# Patient Record
Sex: Female | Born: 1980 | Race: White | Hispanic: No | Marital: Married | Smoking: Never smoker
Health system: Southern US, Community
[De-identification: ages and names within clinical notes are randomized; demographics above are authoritative.]

## PROBLEM LIST (undated history)

## (undated) DIAGNOSIS — J45909 Unspecified asthma, uncomplicated: Secondary | ICD-10-CM

## (undated) DIAGNOSIS — R519 Headache, unspecified: Secondary | ICD-10-CM

## (undated) DIAGNOSIS — R569 Unspecified convulsions: Secondary | ICD-10-CM

## (undated) DIAGNOSIS — R51 Headache: Secondary | ICD-10-CM

## (undated) DIAGNOSIS — Z8619 Personal history of other infectious and parasitic diseases: Secondary | ICD-10-CM

## (undated) HISTORY — PX: BONE MARROW HARVEST: SHX896

## (undated) HISTORY — DX: Headache: R51

## (undated) HISTORY — PX: CYST REMOVAL LEG: SHX6280

## (undated) HISTORY — DX: Unspecified asthma, uncomplicated: J45.909

## (undated) HISTORY — DX: Unspecified convulsions: R56.9

## (undated) HISTORY — DX: Headache, unspecified: R51.9

## (undated) HISTORY — PX: DILATION AND EVACUATION: SHX1459

## (undated) HISTORY — DX: Personal history of other infectious and parasitic diseases: Z86.19

---
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
## 2001-08-26 HISTORY — PX: WISDOM TOOTH EXTRACTION: SHX21

## 2008-05-26 ENCOUNTER — Encounter (HOSPITAL_COMMUNITY): Payer: Self-pay | Admitting: Obstetrics and Gynecology

## 2008-05-26 ENCOUNTER — Ambulatory Visit (HOSPITAL_COMMUNITY): Admission: RE | Admit: 2008-05-26 | Discharge: 2008-05-26 | Payer: Self-pay | Admitting: Obstetrics and Gynecology

## 2009-06-07 ENCOUNTER — Inpatient Hospital Stay (HOSPITAL_COMMUNITY): Admission: AD | Admit: 2009-06-07 | Discharge: 2009-06-10 | Payer: Self-pay | Admitting: Obstetrics and Gynecology

## 2009-08-26 HISTORY — PX: HERNIA REPAIR: SHX51

## 2010-03-26 ENCOUNTER — Encounter
Admission: RE | Admit: 2010-03-26 | Discharge: 2010-03-26 | Payer: Self-pay | Source: Home / Self Care | Admitting: Family Medicine

## 2010-06-18 ENCOUNTER — Encounter
Admission: RE | Admit: 2010-06-18 | Discharge: 2010-06-18 | Payer: Self-pay | Source: Home / Self Care | Admitting: Family Medicine

## 2010-08-22 ENCOUNTER — Ambulatory Visit (HOSPITAL_COMMUNITY)
Admission: RE | Admit: 2010-08-22 | Discharge: 2010-08-22 | Payer: Self-pay | Source: Home / Self Care | Attending: Surgery | Admitting: Surgery

## 2010-11-05 LAB — CBC
Hemoglobin: 13.9 g/dL (ref 12.0–15.0)
MCHC: 34.2 g/dL (ref 30.0–36.0)
Platelets: 184 10*3/uL (ref 150–400)
RBC: 4.84 MIL/uL (ref 3.87–5.11)

## 2010-11-05 LAB — COMPREHENSIVE METABOLIC PANEL
ALT: 18 U/L (ref 0–35)
AST: 20 U/L (ref 0–37)
Albumin: 4 g/dL (ref 3.5–5.2)
CO2: 27 mEq/L (ref 19–32)
Calcium: 9.4 mg/dL (ref 8.4–10.5)
Chloride: 106 mEq/L (ref 96–112)
GFR calc Af Amer: >60 mL/min (ref >60)
GFR calc non Af Amer: >60 mL/min (ref >60)
Sodium: 139 mEq/L (ref 135–145)

## 2010-11-05 LAB — DIFFERENTIAL
Eosinophils Absolute: 0.1 10*3/uL (ref 0.0–0.7)
Eosinophils Relative: 1 % (ref 0–5)
Lymphs Abs: 2.6 10*3/uL (ref 0.7–4.0)
Monocytes Absolute: 0.4 10*3/uL (ref 0.1–1.0)

## 2010-11-05 LAB — SURGICAL PCR SCREEN: Staphylococcus aureus: NEGATIVE

## 2010-11-26 ENCOUNTER — Ambulatory Visit (INDEPENDENT_AMBULATORY_CARE_PROVIDER_SITE_OTHER): Payer: Managed Care, Other (non HMO)

## 2010-11-26 ENCOUNTER — Inpatient Hospital Stay (INDEPENDENT_AMBULATORY_CARE_PROVIDER_SITE_OTHER)
Admission: RE | Admit: 2010-11-26 | Discharge: 2010-11-26 | Disposition: A | Discharge status: Home / Self Care | Payer: Self-pay | Source: Ambulatory Visit | Attending: Family Medicine | Admitting: Family Medicine

## 2010-11-26 DIAGNOSIS — S92309A Fracture of unspecified metatarsal bone(s), unspecified foot, initial encounter for closed fracture: Secondary | ICD-10-CM

## 2010-11-29 LAB — CBC
HCT: 31.8 % — ABNORMAL LOW (ref 36.0–46.0)
HCT: 38.3 % (ref 36.0–46.0)
Hemoglobin: 12.8 g/dL (ref 12.0–15.0)
MCHC: 33.5 g/dL (ref 30.0–36.0)
MCHC: 33.7 g/dL (ref 30.0–36.0)
MCV: 87.2 fL (ref 78.0–100.0)
MCV: 87.8 fL (ref 78.0–100.0)
Platelets: 129 10*3/uL — ABNORMAL LOW (ref 150–400)
RBC: 4.39 MIL/uL (ref 3.87–5.11)
RDW: 15.1 % (ref 11.5–15.5)
RDW: 15.3 % (ref 11.5–15.5)
WBC: 9.7 10*3/uL (ref 4.0–10.5)

## 2011-01-08 NOTE — Op Note (Signed)
NAMESENIYAH, Jenna Harrison                ACCOUNT NO.:  [id number]   MEDICAL RECORD NO.:  [id number]          PATIENT TYPE:  AMB   LOCATION:  SDC                           FACILITY:  [facility]   PHYSICIAN:  Zelphia Cairo, MD    DATE OF BIRTH:  Nov 10, 1980   DATE OF PROCEDURE:  DATE OF DISCHARGE:                               OPERATIVE REPORT   PREOPERATIVE DIAGNOSIS:  Missed abortion.   POSTOPERATIVE DIAGNOSIS:  Missed abortion.   PROCEDURES:  1. Cervical block.  2. Suction D and E.   SURGEON:  Zelphia Cairo, MD   ANESTHESIA:  General and local.   SPECIMEN:  Products of conception for pathology testing and chromosome  testing.   ESTIMATED BLOOD LOSS:  400 mL.   COMPLICATIONS:  None.   CONDITION:  Stable to recovery room.   PROCEDURE:  [name] was taken to the operating room where general  anesthesia was obtained.  She was placed in the dorsal lithotomy  position using Allen stirrups.  She was prepped and draped in sterile  fashion and a catheter was used to drain her bladder for approximately  20 mL of clear urine.  Bivalve speculum was placed in the vagina and a  single-toothed tenaculum on the anterior lip of the cervix.  The cervix  was serially dilated using Pratt dilators.  An 8-French suction catheter  was used to remove products of conception from the uterine cavity.  A  gentle curetting was then performed to ensure uterine cry and the  suction catheter was then reinserted to clear any clots and debris.  No  additional tissue was noted in the suction catheter.  Single-toothed  tenaculum was removed.  Hemostasis was assured speculum was removed and  the patient was taken to the recovery room in stable condition.      Zelphia Cairo, MD  Electronically Signed     GA/MEDQ  D:  05/26/2008  T:  05/27/2008  Job:  [id number]

## 2011-05-28 LAB — CBC
Platelets: 208
RDW: 13.8
WBC: 4.8

## 2012-03-10 ENCOUNTER — Other Ambulatory Visit: Payer: Self-pay | Admitting: Family Medicine

## 2012-03-10 DIAGNOSIS — H539 Unspecified visual disturbance: Secondary | ICD-10-CM

## 2012-03-11 ENCOUNTER — Ambulatory Visit
Admission: RE | Admit: 2012-03-11 | Discharge: 2012-03-11 | Disposition: A | Discharge status: Home / Self Care | Payer: BC Managed Care – PPO | Source: Ambulatory Visit | Attending: Family Medicine | Admitting: Family Medicine

## 2012-03-11 DIAGNOSIS — H539 Unspecified visual disturbance: Secondary | ICD-10-CM

## 2012-03-11 MED ORDER — GADOBENATE DIMEGLUMINE 529 MG/ML IV SOLN: 14.0000 mL | Freq: Once | INTRAVENOUS | Status: AC | PRN

## 2012-07-10 ENCOUNTER — Other Ambulatory Visit: Payer: Self-pay | Admitting: Family Medicine

## 2012-07-10 DIAGNOSIS — R102 Pelvic and perineal pain: Secondary | ICD-10-CM

## 2012-07-10 DIAGNOSIS — R109 Unspecified abdominal pain: Secondary | ICD-10-CM

## 2012-07-13 ENCOUNTER — Other Ambulatory Visit: Payer: BC Managed Care – PPO

## 2012-07-13 ENCOUNTER — Ambulatory Visit
Admission: RE | Admit: 2012-07-13 | Discharge: 2012-07-13 | Disposition: A | Discharge status: Home / Self Care | Payer: BC Managed Care – PPO | Source: Ambulatory Visit | Attending: Family Medicine | Admitting: Family Medicine

## 2012-07-13 DIAGNOSIS — R109 Unspecified abdominal pain: Secondary | ICD-10-CM

## 2012-07-13 DIAGNOSIS — R102 Pelvic and perineal pain unspecified side: Secondary | ICD-10-CM

## 2012-07-16 ENCOUNTER — Ambulatory Visit
Admission: RE | Admit: 2012-07-16 | Discharge: 2012-07-16 | Disposition: A | Discharge status: Home / Self Care | Payer: BC Managed Care – PPO | Source: Ambulatory Visit | Attending: Physician Assistant | Admitting: Physician Assistant

## 2012-07-16 ENCOUNTER — Other Ambulatory Visit: Payer: BC Managed Care – PPO

## 2012-07-16 ENCOUNTER — Other Ambulatory Visit: Payer: Self-pay | Admitting: Physician Assistant

## 2012-07-16 DIAGNOSIS — R52 Pain, unspecified: Secondary | ICD-10-CM

## 2012-07-16 DIAGNOSIS — R109 Unspecified abdominal pain: Secondary | ICD-10-CM

## 2012-07-16 MED ORDER — IOHEXOL 300 MG/ML  SOLN
100.0000 mL | Freq: Once | INTRAMUSCULAR | Status: AC | PRN
  Administered 2012-07-16: 100 mL via INTRAVENOUS

## 2012-08-10 ENCOUNTER — Encounter (INDEPENDENT_AMBULATORY_CARE_PROVIDER_SITE_OTHER): Payer: BC Managed Care – PPO | Admitting: Surgery

## 2012-08-28 ENCOUNTER — Encounter (INDEPENDENT_AMBULATORY_CARE_PROVIDER_SITE_OTHER): Payer: Self-pay | Admitting: Surgery

## 2012-08-28 ENCOUNTER — Ambulatory Visit (INDEPENDENT_AMBULATORY_CARE_PROVIDER_SITE_OTHER): Payer: BC Managed Care – PPO | Admitting: Surgery

## 2012-08-28 VITALS — BP 118/78 | HR 68 | Temp 98.5°F | Resp 16 | Ht 68.0 in | Wt 153.4 lb

## 2012-08-28 DIAGNOSIS — R109 Unspecified abdominal pain: Secondary | ICD-10-CM

## 2012-08-28 NOTE — Progress Notes (Signed)
Patient ID: Jenna Harrison, female   DOB: Jun 02, 1981, 32 y.o.   MRN: [id number]  Chief Complaint  Patient presents with  . Routine Post Op    abd pain    HPI Jenna Harrison is a 32 y.o. female.  Patient returns do to lower abdominal pain. She has pain mostly in her left lower quadrant but sometimes a right lower quadrant. It is made worse with exercise. She is able to run about 5 miles but then develops lower abdominal pain. The pain is made better with rest. There is a bulge in her left lower quadrant abdominal wall associated with this. She does have some periumbilical pain as well. This is made worse with movement. CT scan was done to evaluate we shows no evidence of hernia or other intra-abdominal abnormality. Pelvic ultrasound is normal. HPI  Past Medical History  Diagnosis Date  . Asthma   . Seizures     Past Surgical History  Procedure Date  . Hernia repair 2011  . Wisdom tooth extraction 2003  . Tonsillectomy and adenoidectomy 2001  . Cyst removal leg     Knee  . Vaginal delivery 2010  . Dilation and evacuation     Family History  Problem Relation Age of Onset  . Cancer Mother     Brain,Lung    Social History History  Substance Use Topics  . Smoking status: Never Smoker   . Smokeless tobacco: Never Used  . Alcohol Use: Yes     Comment: Social    Allergies  Allergen Reactions  . Tramadol     Pass Out  . Sulfa Antibiotics     Current Outpatient Prescriptions  Medication Sig Dispense Refill  . ORSYTHIA 0.1-20 MG-MCG tablet         Review of Systems Review of Systems  Constitutional: Negative.   HENT: Negative.   Eyes: Negative.   Respiratory: Negative.   Cardiovascular: Negative.   Gastrointestinal: Positive for nausea and abdominal pain.  Genitourinary: Negative.   Musculoskeletal: Negative.   Neurological: Negative.   Hematological: Negative.   Psychiatric/Behavioral: Negative.     Blood pressure 118/78, pulse 68, temperature 98.5 F (36.9  C), temperature source Temporal, resp. rate 16, height 5\' 8"  (1.727 m), weight 153 lb 6.4 oz (69.582 kg).  Physical Exam Physical Exam  Constitutional: She appears well-developed and well-nourished.  HENT:  Head: Normocephalic and atraumatic.  Eyes: EOM are normal. Pupils are equal, round, and reactive to light.  Neck: Normal range of motion. Neck supple.  Abdominal:      Data Reviewed Clinical Data: 32 year old female with nausea vomiting and fever.  Periumbilical pain. History of bilateral inguinal hernia repair.  CT ABDOMEN AND PELVIS WITH CONTRAST  Technique: Multidetector CT imaging of the abdomen and pelvis was  performed following the standard protocol during bolus  administration of intravenous contrast.  Contrast: OMNIPAQUE IOHEXOL 300 MG/ML SOLN  Comparison: abdominal and pelvic ultrasound 07/13/2012. CT abdomen  without contrast 06/18/2010.  Findings: Lung bases remain clear. No acute osseous abnormality  identified. Incidental incompletely fused posterior elements in  the lower thoracic spine. Mild chronic deformity of the right  iliac wing.  Bilateral inguinal ligament surgical clips. Otherwise normal  inguinal regions. No pelvic free fluid. Uterus and adnexa within  normal limits.  Oral contrast has reached the rectum. Negative distal colon. The  appendix is not clearly delineated, but there are no pericecal  inflammatory changes. The terminal ileum and ileocecal valve are  normal. The  appendix may be collapse and extending along the right  pelvic sidewall, with no surrounding inflammation. No dilated  small bowel loops. Stomach and duodenum within normal limits.  Liver, gallbladder, spleen, pancreas, adrenal glands and portal  venous system within normal limits. Major arterial structures in  the abdomen and pelvis are normal.  Bilateral extrarenal pelves, no perinephric stranding, and no  hydroureter or periureteral stranding. No urologic calculus    identified. Bladder is unremarkable.  No ventral abdominal hernia identified. No mesenteric  inflammation. No abdominal free fluid. No lymphadenopathy  identified.  IMPRESSION:  1. No acute inflammatory findings in the abdomen or pelvis.  Appendix not clearly delineated, but no pericecal inflammation.  2. Previous bilateral inguinal hernia repair with no adverse  features.  3. Suspect extrarenal pelves rather than hydronephrosis responsible  for the recent ultrasound appearance of the kidneys. No urologic  calculus or other secondary findings of obstructive uropathy.   Assessment    Abdominal pain      Plan    Laparoscopy to further evaluate abdominal pain. She had previous a lateral laparoscopic inguinal hernia repair 2 years ago and the pain is made worse with excessive exercise. She uses a bulge in her left lower quadrant as well which is new. Workup has included a CT scan, pelvic ultrasound and these have all been negative for any abnormality. She would like to proceed with diagnostic laparoscopy to further evaluate her lower abdominal pain.The procedure has been discussed with the patient.  Alternative therapies have been discussed with the patient.  Operative risks include bleeding,  Infection,  Organ injury,  Nerve injury,  Blood vessel injury,  DVT,  Pulmonary embolism,  Death,  And possible reoperation.  Medical management risks include worsening of present situation.  The success of the procedure is 50 -90 % at treating patients symptoms.  The patient understands and agrees to proceed.       Dominick Zertuche A. 08/28/2012, 5:37 PM

## 2012-08-28 NOTE — Patient Instructions (Signed)
Call when ready to schedule surgery

## 2012-09-03 ENCOUNTER — Encounter (INDEPENDENT_AMBULATORY_CARE_PROVIDER_SITE_OTHER): Payer: Self-pay

## 2012-09-03 ENCOUNTER — Telehealth (INDEPENDENT_AMBULATORY_CARE_PROVIDER_SITE_OTHER): Payer: Self-pay

## 2012-09-03 NOTE — Telephone Encounter (Signed)
I called patient and told her expectant time out of work is 2-4 weeks. I also explained that there could be complication in surgery that could always keep her out longer. She said she wanted note to state up to 4 weeks since she has gone through this type surgery before and she went back to work in 3 weeks. I told her driving restriction was at least 10 days and could be up to 2 weeks. I told her no driving while on pain medication. Patient wants note faxed to [phone] attn Gerri Lins.

## 2012-09-03 NOTE — Telephone Encounter (Signed)
Message copied by Brennan Bailey on Thu Sep 03, 2012  9:35 AM ------      Message from: Rise Paganini      Created: Wed Sep 02, 2012  8:33 AM      Regarding: Cornett      Contact: [phone]       Patient stated that she will need a note with amount of days that she will be out of work. Please include the driving restriction and any other details. Call if you have any questions.

## 2012-09-08 ENCOUNTER — Telehealth (INDEPENDENT_AMBULATORY_CARE_PROVIDER_SITE_OTHER): Payer: Self-pay

## 2012-09-08 NOTE — Telephone Encounter (Signed)
LMOM that I returned phone call. If pt calls back, please get the information she needs in the letter and the fax number and I will be glad to letter to her employer.

## 2012-09-08 NOTE — Telephone Encounter (Signed)
Message copied by Brennan Bailey on Tue Sep 08, 2012  1:23 PM ------      Message from: Delcie Roch      Created: Tue Sep 08, 2012  8:39 AM      Regarding: return to work      Contact: [phone]       Pt's employer is not satisfied with the previous return to work information that was supplied because it is an estimate.  Please call the patient.

## 2012-09-15 ENCOUNTER — Telehealth (INDEPENDENT_AMBULATORY_CARE_PROVIDER_SITE_OTHER): Payer: Self-pay

## 2012-09-15 NOTE — Telephone Encounter (Signed)
Patient called to say her OB/GYN told her she tested positive for parasite. She is being referred to Dr. Kinnie Scales for treatment but cannot get an appointment because he is out of office sick. She called to ask if this would affect her surgery that is scheduled for 1/30. I told her I would call her Monday when Cornett is back in office and touch base with her and see if she has appt with [facility] yet.

## 2012-09-24 DIAGNOSIS — R109 Unspecified abdominal pain: Secondary | ICD-10-CM

## 2012-10-09 ENCOUNTER — Telehealth (INDEPENDENT_AMBULATORY_CARE_PROVIDER_SITE_OTHER): Payer: Self-pay

## 2012-10-09 NOTE — Telephone Encounter (Signed)
Message copied by Brennan Bailey on Fri Oct 09, 2012  1:23 PM ------      Message from: Delcie Roch      Created: Fri Oct 09, 2012 12:40 PM      Regarding: R/S FOR 1ST PO - [facility]      Contact: [phone]       PLEASE R/S PTS 1ST PO - [facility] ------

## 2012-10-09 NOTE — Telephone Encounter (Signed)
LMOM with new appt info. Told her to call if she could not make that appt.

## 2012-10-12 ENCOUNTER — Encounter (INDEPENDENT_AMBULATORY_CARE_PROVIDER_SITE_OTHER): Payer: BC Managed Care – PPO | Admitting: Surgery

## 2012-10-27 ENCOUNTER — Encounter (INDEPENDENT_AMBULATORY_CARE_PROVIDER_SITE_OTHER): Payer: Self-pay | Admitting: Surgery

## 2012-10-27 ENCOUNTER — Ambulatory Visit (INDEPENDENT_AMBULATORY_CARE_PROVIDER_SITE_OTHER): Payer: BC Managed Care – PPO | Admitting: Surgery

## 2012-10-27 VITALS — BP 108/68 | HR 64 | Resp 14 | Ht 68.0 in | Wt 157.6 lb

## 2012-10-27 DIAGNOSIS — Z9889 Other specified postprocedural states: Secondary | ICD-10-CM

## 2012-10-27 NOTE — Progress Notes (Signed)
Patient returns after diagnostic laparoscopy for pelvic pain. The results showed no abnormality. She has no recurrent hernia. Internal organs are normal.  Exam: The incision at umbilicus is clean dry and intact.  Impression: Status post diagnostic laparoscopy for pelvic and groin pain with history of bilateral inguinal hernia repair with mesh  Plan: Refer to pain clinic to see if they can help with her pain. There is no structural abnormality to explain her pain. I discussed mesh removal but this would leave her with the same amount of pain postop or worse. There is still an option but this needs to be the last option. She also has pain around her umbilicus and etiology of this is unclear.

## 2012-10-27 NOTE — Patient Instructions (Signed)
Will refer to pain clinic for evaluation of pain.

## 2012-10-28 ENCOUNTER — Other Ambulatory Visit (INDEPENDENT_AMBULATORY_CARE_PROVIDER_SITE_OTHER): Payer: Self-pay | Admitting: General Surgery

## 2014-08-09 LAB — OB RESULTS CONSOLE RUBELLA ANTIBODY, IGM
RUBELLA: NON-IMMUNE/NOT IMMUNE
Rubella: NON-IMMUNE/NOT IMMUNE

## 2014-08-24 LAB — OB RESULTS CONSOLE RPR: RPR: NONREACTIVE

## 2014-08-24 LAB — OB RESULTS CONSOLE ABO/RH: RH TYPE: POSITIVE

## 2014-08-24 LAB — OB RESULTS CONSOLE HIV ANTIBODY (ROUTINE TESTING): HIV: NONREACTIVE

## 2014-08-24 LAB — OB RESULTS CONSOLE GC/CHLAMYDIA
Chlamydia: NEGATIVE
GC PROBE AMP, GENITAL: NEGATIVE

## 2014-08-24 LAB — OB RESULTS CONSOLE RUBELLA ANTIBODY, IGM: RUBELLA: IMMUNE

## 2014-08-24 LAB — OB RESULTS CONSOLE ANTIBODY SCREEN: ANTIBODY SCREEN: NEGATIVE

## 2014-08-24 LAB — OB RESULTS CONSOLE HEPATITIS B SURFACE ANTIGEN: HEP B S AG: NEGATIVE

## 2014-08-26 NOTE — L&D Delivery Note (Signed)
Delivery Note  SVD viable female over 2nd degree ML lac.  Nuchal reduced but with delivery cord avulsed and immediately clamped at umbilicus.  The baby had slow response to stimulation so NICU called and resusitation started..  Placenta delivered spontaneously intact with 3VC. Repair with 2-0 Chromic with good support and hemostasis noted and R/V exam confirms.  PH art was sent.  [place] cord blood was not done.  Mother and baby were doing well.  EBL 300cc  Candice Camp, MD

## 2014-08-30 ENCOUNTER — Other Ambulatory Visit: Payer: Self-pay | Admitting: Obstetrics and Gynecology

## 2014-08-31 LAB — CYTOLOGY - PAP

## 2014-12-14 ENCOUNTER — Inpatient Hospital Stay (HOSPITAL_COMMUNITY)
Admission: AD | Admit: 2014-12-14 | Payer: BC Managed Care – PPO | Source: Ambulatory Visit | Admitting: Obstetrics and Gynecology

## 2015-02-17 LAB — OB RESULTS CONSOLE GBS: GBS: POSITIVE

## 2015-03-16 ENCOUNTER — Telehealth (HOSPITAL_COMMUNITY): Payer: Self-pay | Admitting: *Deleted

## 2015-03-16 ENCOUNTER — Encounter (HOSPITAL_COMMUNITY): Payer: Self-pay | Admitting: *Deleted

## 2015-03-16 NOTE — Telephone Encounter (Signed)
Preadmission screen  

## 2015-03-17 ENCOUNTER — Inpatient Hospital Stay (HOSPITAL_COMMUNITY): Payer: BC Managed Care – PPO | Admitting: Anesthesiology

## 2015-03-17 ENCOUNTER — Encounter (HOSPITAL_COMMUNITY): Payer: Self-pay

## 2015-03-17 ENCOUNTER — Inpatient Hospital Stay (HOSPITAL_COMMUNITY)
Admission: RE | Admit: 2015-03-17 | Discharge: 2015-03-19 | DRG: 775 | Disposition: A | Discharge status: Home / Self Care | Payer: BC Managed Care – PPO | Source: Ambulatory Visit | Attending: Obstetrics and Gynecology | Admitting: Obstetrics and Gynecology

## 2015-03-17 DIAGNOSIS — O99824 Streptococcus B carrier state complicating childbirth: Secondary | ICD-10-CM | POA: Diagnosis present

## 2015-03-17 DIAGNOSIS — Z3490 Encounter for supervision of normal pregnancy, unspecified, unspecified trimester: Secondary | ICD-10-CM

## 2015-03-17 DIAGNOSIS — Z3A39 39 weeks gestation of pregnancy: Secondary | ICD-10-CM | POA: Diagnosis present

## 2015-03-17 DIAGNOSIS — Z823 Family history of stroke: Secondary | ICD-10-CM | POA: Diagnosis not present

## 2015-03-17 DIAGNOSIS — O9989 Other specified diseases and conditions complicating pregnancy, childbirth and the puerperium: Secondary | ICD-10-CM | POA: Diagnosis present

## 2015-03-17 LAB — CBC
HCT: 33.7 % — ABNORMAL LOW (ref 36.0–46.0)
HEMOGLOBIN: 11.4 g/dL — AB (ref 12.0–15.0)
MCH: 28.9 pg (ref 26.0–34.0)
MCHC: 33.8 g/dL (ref 30.0–36.0)
MCV: 85.5 fL (ref 78.0–100.0)
Platelets: 157 10*3/uL (ref 150–400)
RBC: 3.94 MIL/uL (ref 3.87–5.11)
RDW: 14.2 % (ref 11.5–15.5)
WBC: 8.8 10*3/uL (ref 4.0–10.5)

## 2015-03-17 LAB — TYPE AND SCREEN
ABO/RH(D): O POS
Antibody Screen: NEGATIVE

## 2015-03-17 LAB — RPR: RPR: NONREACTIVE

## 2015-03-17 LAB — ABO/RH: ABO/RH(D): O POS

## 2015-03-17 MED ORDER — FLEET ENEMA 7-19 GM/118ML RE ENEM: 1.0000 | ENEMA | RECTAL | Status: DC | PRN

## 2015-03-17 MED ORDER — MISOPROSTOL 25 MCG QUARTER TABLET
25.0000 ug | ORAL_TABLET | ORAL | Status: DC | PRN
  Administered 2015-03-17: 25 ug via VAGINAL
  Filled 2015-03-17: qty 0.25
  Filled 2015-03-17: qty 1

## 2015-03-17 MED ORDER — OXYTOCIN BOLUS FROM INFUSION: 500.0000 mL | INTRAVENOUS | Status: DC

## 2015-03-17 MED ORDER — MEDROXYPROGESTERONE ACETATE 150 MG/ML IM SUSP: 150.0000 mg | INTRAMUSCULAR | Status: DC | PRN

## 2015-03-17 MED ORDER — CITRIC ACID-SODIUM CITRATE 334-500 MG/5ML PO SOLN: 30.0000 mL | ORAL | Status: DC | PRN

## 2015-03-17 MED ORDER — EPHEDRINE 5 MG/ML INJ
10.0000 mg | INTRAVENOUS | Status: DC | PRN
  Filled 2015-03-17: qty 2

## 2015-03-17 MED ORDER — ONDANSETRON HCL 4 MG/2ML IJ SOLN: 4.0000 mg | INTRAMUSCULAR | Status: DC | PRN

## 2015-03-17 MED ORDER — LACTATED RINGERS IV SOLN
500.0000 mL | INTRAVENOUS | Status: DC | PRN
  Administered 2015-03-17: 1000 mL via INTRAVENOUS

## 2015-03-17 MED ORDER — ONDANSETRON HCL 4 MG/2ML IJ SOLN: 4.0000 mg | Freq: Four times a day (QID) | INTRAMUSCULAR | Status: DC | PRN

## 2015-03-17 MED ORDER — DIPHENHYDRAMINE HCL 25 MG PO CAPS: 25.0000 mg | ORAL_CAPSULE | Freq: Four times a day (QID) | ORAL | Status: DC | PRN

## 2015-03-17 MED ORDER — SIMETHICONE 80 MG PO CHEW: 80.0000 mg | CHEWABLE_TABLET | ORAL | Status: DC | PRN

## 2015-03-17 MED ORDER — OXYTOCIN 40 UNITS IN LACTATED RINGERS INFUSION - SIMPLE MED
62.5000 mL/h | INTRAVENOUS | Status: DC
  Administered 2015-03-17: 62.5 mL/h via INTRAVENOUS

## 2015-03-17 MED ORDER — OXYCODONE-ACETAMINOPHEN 5-325 MG PO TABS: 2.0000 | ORAL_TABLET | ORAL | Status: DC | PRN

## 2015-03-17 MED ORDER — LIDOCAINE HCL (PF) 1 % IJ SOLN
INTRAMUSCULAR | Status: DC | PRN
  Administered 2015-03-17 (×2): 8 mL via EPIDURAL

## 2015-03-17 MED ORDER — LACTATED RINGERS IV SOLN: INTRAVENOUS | Status: DC

## 2015-03-17 MED ORDER — PENICILLIN G POTASSIUM 5000000 UNITS IJ SOLR
5.0000 10*6.[IU] | Freq: Once | INTRAVENOUS | Status: AC
  Administered 2015-03-17: 5 10*6.[IU] via INTRAVENOUS
  Filled 2015-03-17: qty 5

## 2015-03-17 MED ORDER — FENTANYL 2.5 MCG/ML BUPIVACAINE 1/10 % EPIDURAL INFUSION (WH - ANES)
14.0000 mL/h | INTRAMUSCULAR | Status: DC | PRN
  Administered 2015-03-17: 14 mL/h via EPIDURAL
  Filled 2015-03-17: qty 125

## 2015-03-17 MED ORDER — WITCH HAZEL-GLYCERIN EX PADS: 1.0000 "application " | MEDICATED_PAD | CUTANEOUS | Status: DC | PRN

## 2015-03-17 MED ORDER — ZOLPIDEM TARTRATE 5 MG PO TABS: 5.0000 mg | ORAL_TABLET | Freq: Every evening | ORAL | Status: DC | PRN

## 2015-03-17 MED ORDER — DIPHENHYDRAMINE HCL 50 MG/ML IJ SOLN: 12.5000 mg | INTRAMUSCULAR | Status: DC | PRN

## 2015-03-17 MED ORDER — LIDOCAINE HCL (PF) 1 % IJ SOLN
30.0000 mL | INTRAMUSCULAR | Status: DC | PRN
  Filled 2015-03-17: qty 30

## 2015-03-17 MED ORDER — TETANUS-DIPHTH-ACELL PERTUSSIS 5-2.5-18.5 LF-MCG/0.5 IM SUSP: 0.5000 mL | Freq: Once | INTRAMUSCULAR | Status: DC

## 2015-03-17 MED ORDER — DIBUCAINE 1 % RE OINT: 1.0000 "application " | TOPICAL_OINTMENT | RECTAL | Status: DC | PRN

## 2015-03-17 MED ORDER — ACETAMINOPHEN 325 MG PO TABS: 650.0000 mg | ORAL_TABLET | ORAL | Status: DC | PRN

## 2015-03-17 MED ORDER — PENICILLIN G POTASSIUM 5000000 UNITS IJ SOLR
2.5000 10*6.[IU] | INTRAVENOUS | Status: DC
  Administered 2015-03-17: 2.5 10*6.[IU] via INTRAVENOUS
  Filled 2015-03-17 (×6): qty 2.5

## 2015-03-17 MED ORDER — SENNOSIDES-DOCUSATE SODIUM 8.6-50 MG PO TABS
2.0000 | ORAL_TABLET | ORAL | Status: DC
  Administered 2015-03-17: 2 via ORAL
  Filled 2015-03-17 (×2): qty 2

## 2015-03-17 MED ORDER — PHENYLEPHRINE 40 MCG/ML (10ML) SYRINGE FOR IV PUSH (FOR BLOOD PRESSURE SUPPORT)
80.0000 ug | PREFILLED_SYRINGE | INTRAVENOUS | Status: DC | PRN
  Filled 2015-03-17: qty 2
  Filled 2015-03-17: qty 20

## 2015-03-17 MED ORDER — ONDANSETRON HCL 4 MG PO TABS: 4.0000 mg | ORAL_TABLET | ORAL | Status: DC | PRN

## 2015-03-17 MED ORDER — BENZOCAINE-MENTHOL 20-0.5 % EX AERO
1.0000 "application " | INHALATION_SPRAY | CUTANEOUS | Status: DC | PRN
  Administered 2015-03-17 – 2015-03-19 (×2): 1 via TOPICAL
  Filled 2015-03-17 (×2): qty 56

## 2015-03-17 MED ORDER — OXYCODONE-ACETAMINOPHEN 5-325 MG PO TABS: 1.0000 | ORAL_TABLET | ORAL | Status: DC | PRN

## 2015-03-17 MED ORDER — MEASLES, MUMPS & RUBELLA VAC ~~LOC~~ INJ
0.5000 mL | INJECTION | Freq: Once | SUBCUTANEOUS | Status: AC
  Administered 2015-03-19: 0.5 mL via SUBCUTANEOUS
  Filled 2015-03-17: qty 0.5

## 2015-03-17 MED ORDER — LANOLIN HYDROUS EX OINT: TOPICAL_OINTMENT | CUTANEOUS | Status: DC | PRN

## 2015-03-17 MED ORDER — OXYTOCIN 40 UNITS IN LACTATED RINGERS INFUSION - SIMPLE MED
1.0000 m[IU]/min | INTRAVENOUS | Status: DC
  Administered 2015-03-17: 2 m[IU]/min via INTRAVENOUS
  Filled 2015-03-17: qty 1000

## 2015-03-17 MED ORDER — ALBUTEROL SULFATE (2.5 MG/3ML) 0.083% IN NEBU: 3.0000 mL | INHALATION_SOLUTION | Freq: Four times a day (QID) | RESPIRATORY_TRACT | Status: DC | PRN

## 2015-03-17 MED ORDER — TERBUTALINE SULFATE 1 MG/ML IJ SOLN
0.2500 mg | Freq: Once | INTRAMUSCULAR | Status: DC | PRN
  Filled 2015-03-17: qty 1

## 2015-03-17 MED ORDER — PRENATAL MULTIVITAMIN CH
1.0000 | ORAL_TABLET | Freq: Every day | ORAL | Status: DC
  Administered 2015-03-18 – 2015-03-19 (×2): 1 via ORAL
  Filled 2015-03-17 (×2): qty 1

## 2015-03-17 MED ORDER — IBUPROFEN 600 MG PO TABS
600.0000 mg | ORAL_TABLET | Freq: Four times a day (QID) | ORAL | Status: DC
  Administered 2015-03-17 – 2015-03-19 (×8): 600 mg via ORAL
  Filled 2015-03-17 (×8): qty 1

## 2015-03-17 NOTE — Consult Note (Signed)
Neonatology Note:  Attendance at Code Apgar:   Our team responded to a Code Apgar call to room # 168 following NSVD, due to infant with apnea following NSVD complicated by cord avulsion (occurring after delivery of head but before body). The requesting physician was Dr. Rana Snare. The mother is a G3P1A1 O pos, GBS neg with an uncomplicated pregnancy. ROM occurred 7 hours PTD and the fluid was clear.  At delivery, the baby was apneic and floppy with a HR < 100. The OB nursing staff in attendance gave vigorous stimulation and a Code Apgar was called. They applied PPV for about 30 seconds. Our team arrived at 2 minutes of life, at which time the baby was still apneic, so PPV was continued by E. Snyder, RT for about another 30 seconds. The baby began to cry at 3 minutes and tone rapidly improved. O2 saturations were within expected parameters by 4-5 minutes in room air. Perfusion was good, no respiratory distress, and lungs clear to auscultation. HR was about 175-180. The 5-min Apgar score was 9 (1-min score to be assigned by OB nursing staff).  I spoke with the parents in the DR, then transferred the baby to the Pediatrician's care.   Doretha Sou, MD

## 2015-03-17 NOTE — Anesthesia Preprocedure Evaluation (Signed)
Anesthesia Evaluation  Patient identified by MRN, date of birth, ID band Patient awake    Reviewed: Allergy & Precautions, H&P , NPO status , Patient's Chart, lab work & pertinent test results  Airway Mallampati: II  TM Distance: >3 FB Neck ROM: full    Dental no notable dental hx.    Pulmonary    Pulmonary exam normal       Cardiovascular negative cardio ROS Normal cardiovascular exam    Neuro/Psych negative psych ROS   GI/Hepatic negative GI ROS, Neg liver ROS,   Endo/Other  negative endocrine ROS  Renal/GU negative Renal ROS     Musculoskeletal   Abdominal (+) + obese,   Peds  Hematology negative hematology ROS (+)   Anesthesia Other Findings   Reproductive/Obstetrics (+) Pregnancy                             Anesthesia Physical Anesthesia Plan  ASA: II  Anesthesia Plan: Epidural   Post-op Pain Management:    Induction:   Airway Management Planned:   Additional Equipment:   Intra-op Plan:   Post-operative Plan:   Informed Consent: I have reviewed the patients History and Physical, chart, labs and discussed the procedure including the risks, benefits and alternatives for the proposed anesthesia with the patient or authorized representative who has indicated his/her understanding and acceptance.     Plan Discussed with:   Anesthesia Plan Comments:         Anesthesia Quick Evaluation

## 2015-03-17 NOTE — Anesthesia Procedure Notes (Signed)
Epidural Patient location during procedure: OB Start time: 03/17/2015 11:12 AM End time: 03/17/2015 11:17 AM  Staffing Anesthesiologist: Leilani Able Performed by: anesthesiologist   Preanesthetic Checklist Completed: patient identified, site marked, surgical consent, pre-op evaluation, timeout performed, IV checked, risks and benefits discussed and monitors and equipment checked  Epidural Patient position: sitting Prep: site prepped and draped and DuraPrep Patient monitoring: continuous pulse ox and blood pressure Approach: midline Location: L3-L4 Injection technique: LOR air  Needle:  Needle type: Tuohy  Needle gauge: 17 G Needle length: 9 cm and 9 Needle insertion depth: 5 cm cm Catheter type: closed end flexible Catheter size: 19 Gauge Catheter at skin depth: 10 cm Test dose: negative and Other  Assessment Sensory level: T9 Events: blood not aspirated, injection not painful, no injection resistance, negative IV test and no paresthesia

## 2015-03-17 NOTE — H&P (Signed)
Jenna Harrison is a 34 y.o. female presenting for IOL per Dr Renaldo Fiddler.  Preg uncomplicated.  GBS -. History OB History    Gravida Para Term Preterm AB TAB SAB Ectopic Multiple Living   Past Medical History  Diagnosis Date  . Asthma   . Seizures   . Hx of varicella   . Headache     migraines   Past Surgical History  Procedure Laterality Date  . Hernia repair  2011  . Wisdom tooth extraction  2003  . Tonsillectomy and adenoidectomy  2001  . Cyst removal leg      Knee  . Vaginal delivery  2010  . Dilation and evacuation    . Bone marrow harvest      with a bone cyst also removed   Family History: family history includes Cancer in her father and mother; Seizures in her sister; Stroke in her maternal grandmother; Thyroid disease in her maternal aunt. Social History:  reports that she has never smoked. She has never used smokeless tobacco. She reports that she drinks alcohol. She reports that she does not use illicit drugs.   Prenatal Transfer Tool  Maternal Diabetes: No Genetic Screening: Normal Maternal Ultrasounds/Referrals: Normal Fetal Ultrasounds or other Referrals:  None Maternal Substance Abuse:  No Significant Maternal Medications:  None Significant Maternal Lab Results:  None Other Comments:  None  ROS  Dilation: 2 Effacement (%): Thick Station: -2 Exam by:: Jenna Harrison Blood pressure 110/62, pulse 67, temperature 98.3 F (36.8 C), temperature source Oral, resp. rate 20, height  (1.727 m), weight 210 lb 2 oz (95.312 kg), last menstrual period 06/13/2014. Exam Physical Exam  Prenatal labs: ABO, Rh: --/--/O POS, O POS (07/22 0115) Antibody: NEG (07/22 0115) Rubella: Immune (12/30 0000) RPR: Nonreactive (12/30 0000)  HBsAg: Negative (12/30 0000)  HIV: Non-reactive (12/30 0000)  GBS: Positive (06/24 0000)   Assessment/Plan: IUP at 39 + weeks Desires IOL [place] last week EFM 8+3lbs S/P cytotec.  Pitocin and AROM now    Jenna Harrison  C 03/17/2015, 9:05 AM

## 2015-03-18 LAB — CBC
HCT: 31.9 % — ABNORMAL LOW (ref 36.0–46.0)
HEMOGLOBIN: 10.4 g/dL — AB (ref 12.0–15.0)
MCH: 27.8 pg (ref 26.0–34.0)
MCHC: 32.6 g/dL (ref 30.0–36.0)
MCV: 85.3 fL (ref 78.0–100.0)
Platelets: 155 10*3/uL (ref 150–400)
RBC: 3.74 MIL/uL — AB (ref 3.87–5.11)
RDW: 14.3 % (ref 11.5–15.5)
WBC: 9.4 10*3/uL (ref 4.0–10.5)

## 2015-03-18 NOTE — Lactation Note (Signed)
This note was copied from the chart of Jenna Harrison. Lactation Consultation Note  Patient Name: Jenna Harrison [id number] Date: 03/18/2015 Reason for consult: Initial assessment Visited with Mom and FOB, baby 35 hrs old.  This is Mom's second child.  Her first child, she was told to stop breastfeeding due to probably lactose intolerance.  It turned out to be a GI problem other than intolerance to breast milk.  Mom really wants breastfeeding to go well.  Baby has fed 5 times in first day.  Mom started to position baby for a feeding.  Encouraged her to undress baby to a diaper, with explanation on why this is beneficial.  Mom started to position baby in cradle hold, and baby latched onto nipple.  Mom has large erect nipples and " baby usually latches onto nipple and then works her way deeper" per Mom.  Talked with Mom about using a cross cradle hold, and pillow support.  With assistance, Mom able to latch baby with a deep, wide gape of mouth.  Teaching done on how to recognize nutritive sucking.  Basics reviewed with Mom.  Brochure left in room.  Explained about IP and OP lactation services available. Encouraged Mom to call prn, and Follow-up in am.    Consult Status Consult Status: Follow-up Date: 03/19/15 Follow-up type: In-patient    Jenna Harrison 03/18/2015, 2:51 PM

## 2015-03-18 NOTE — Progress Notes (Signed)
Post Partum Day 1 Subjective: no complaints, up ad lib, voiding and tolerating PO  Objective: Blood pressure 104/55, pulse 66, temperature 98.1 F (36.7 C), temperature source Oral, resp. rate 16, height  (1.727 m), weight 210 lb 2 oz (95.312 kg), last menstrual period 06/13/2014, SpO2 98 %.  Physical Exam:  General: alert, cooperative, appears stated age and no distress Lochia: appropriate Uterine Fundus: firm Incision: healing well DVT Evaluation: No evidence of DVT seen on physical exam.   Recent Labs  03/17/15 0115 03/18/15 0523  HGB 11.4* 10.4*  HCT 33.7* 31.9*    Assessment/Plan: Plan for discharge tomorrow and Breastfeeding   LOS: 2 days   [place] Warriner C 03/18/2015, 10:09 AM

## 2015-03-18 NOTE — Anesthesia Postprocedure Evaluation (Signed)
  Anesthesia Post-op Note  Patient: Jenna Harrison  Procedure(s) Performed: * No procedures listed *  Patient Location: Mother/Baby  Anesthesia Type:Epidural  Level of Consciousness: awake, alert , oriented and patient cooperative  Airway and Oxygen Therapy: Patient Spontanous Breathing  Post-op Pain: none  Post-op Assessment: Post-op Vital signs reviewed, Patient's Cardiovascular Status Stable, Respiratory Function Stable, Patent Airway, No headache, No backache and Patient able to bend at knees              Post-op Vital Signs: Reviewed and stable  Last Vitals:  Filed Vitals:   03/18/15 0540  BP: 104/55  Pulse: 66  Temp: 36.7 C  Resp: 16    Complications: No apparent anesthesia complications

## 2015-03-19 ENCOUNTER — Ambulatory Visit: Payer: Self-pay

## 2015-03-19 ENCOUNTER — Encounter (HOSPITAL_COMMUNITY): Payer: Self-pay

## 2015-03-19 MED ORDER — IBUPROFEN 600 MG PO TABS: 600.0000 mg | ORAL_TABLET | Freq: Four times a day (QID) | ORAL | Status: AC

## 2015-03-19 NOTE — Discharge Summary (Signed)
Obstetric Discharge Summary Reason for Admission: induction of labor Prenatal Procedures: none Intrapartum Procedures: spontaneous vaginal delivery Postpartum Procedures: none Complications-Operative and Postpartum: 2 degree perineal laceration HEMOGLOBIN  Date Value Ref Range Status  03/18/2015 10.4* 12.0 - 15.0 g/dL Final   HCT  Date Value Ref Range Status  03/18/2015 31.9* 36.0 - 46.0 % Final    Physical Exam:  General: alert, cooperative and appears stated age 34: appropriate Uterine Fundus: firm Incision: healing well DVT Evaluation: No evidence of DVT seen on physical exam.  Discharge Diagnoses: Term Pregnancy-delivered  Discharge Information: Date: 03/19/2015 Activity: pelvic rest Diet: routine Medications: Ibuprofen Condition: stable Instructions: refer to practice specific booklet Discharge to: home   Newborn Data: Live born female  Birth Weight: 8 lb 3 oz (3715 g) APGAR: 1, 9  Home with mother.  Audrielle Vankuren C 03/19/2015, 9:45 AM

## 2015-03-19 NOTE — Lactation Note (Addendum)
This note was copied from the chart of Jenna Harrison. Lactation Consultation Note  Patient Name: Jenna Harrison [id number] Date: 03/19/2015 Reason for consult: Follow-up assessment;Other (Comment) (7% weight loss )  Baby is 45 hours old and has been to the breast consistently 10-45 mins , per mom last fed at 1000 for 40 mins 7% weight loss, Latch scores 7 by LC , voids and stools QS for age .  hours old Bili - 5.4.  LC reviewed sore nipples and engorgement prevention and tx. Referring to the Baby and me Booklet. Mother informed of post-discharge support and given phone number to the lactation department, including services for phone call assistance; out-patient appointments; and breastfeeding support group. List of other breastfeeding resources in the community given in the handout. Encouraged mother to call for problems or concerns related to breastfeeding.   Maternal Data    Feeding Feeding Type:  (per mom last fed at 1000 ) Length of feed: 40 min (per mom fed 20 mins both breast )  LATCH Score/Interventions                Intervention(s): Breastfeeding basics reviewed     Lactation Tools Discussed/Used WIC Program: No   Consult Status Consult Status: Complete Date: 03/19/15    Kathrin Greathouse 03/19/2015, 12:52 PM

## 2015-03-27 ENCOUNTER — Encounter (HOSPITAL_COMMUNITY): Payer: Self-pay

## 2015-04-20 ENCOUNTER — Other Ambulatory Visit: Payer: Self-pay | Admitting: Obstetrics and Gynecology

## 2015-04-21 LAB — CYTOLOGY - PAP

## 2018-06-13 ENCOUNTER — Emergency Department (HOSPITAL_COMMUNITY): Payer: BLUE CROSS/BLUE SHIELD

## 2018-06-13 ENCOUNTER — Other Ambulatory Visit: Payer: Self-pay

## 2018-06-13 ENCOUNTER — Emergency Department (HOSPITAL_COMMUNITY): Payer: BLUE CROSS/BLUE SHIELD | Admitting: Certified Registered Nurse Anesthetist

## 2018-06-13 ENCOUNTER — Encounter (HOSPITAL_COMMUNITY)
Admission: EM | Disposition: A | Discharge status: Home / Self Care | Payer: Self-pay | Source: Home / Self Care | Attending: Emergency Medicine

## 2018-06-13 ENCOUNTER — Observation Stay (HOSPITAL_COMMUNITY)
Admission: EM | Admit: 2018-06-13 | Discharge: 2018-06-15 | Disposition: A | Discharge status: Home / Self Care | Payer: BLUE CROSS/BLUE SHIELD | Attending: General Surgery | Admitting: General Surgery

## 2018-06-13 ENCOUNTER — Encounter (HOSPITAL_COMMUNITY): Payer: Self-pay | Admitting: Emergency Medicine

## 2018-06-13 DIAGNOSIS — Z79899 Other long term (current) drug therapy: Secondary | ICD-10-CM | POA: Insufficient documentation

## 2018-06-13 DIAGNOSIS — Z888 Allergy status to other drugs, medicaments and biological substances status: Secondary | ICD-10-CM | POA: Insufficient documentation

## 2018-06-13 DIAGNOSIS — Z882 Allergy status to sulfonamides status: Secondary | ICD-10-CM | POA: Diagnosis not present

## 2018-06-13 DIAGNOSIS — R3129 Other microscopic hematuria: Secondary | ICD-10-CM

## 2018-06-13 DIAGNOSIS — K3580 Unspecified acute appendicitis: Secondary | ICD-10-CM | POA: Diagnosis not present

## 2018-06-13 DIAGNOSIS — J45909 Unspecified asthma, uncomplicated: Secondary | ICD-10-CM | POA: Diagnosis not present

## 2018-06-13 HISTORY — PX: LAPAROSCOPIC APPENDECTOMY: SHX408

## 2018-06-13 LAB — URINALYSIS, ROUTINE W REFLEX MICROSCOPIC
BILIRUBIN URINE: NEGATIVE
Glucose, UA: NEGATIVE mg/dL
KETONES UR: NEGATIVE mg/dL
Leukocytes, UA: NEGATIVE
NITRITE: NEGATIVE
Protein, ur: NEGATIVE mg/dL
Specific Gravity, Urine: 1.015 (ref 1.005–1.030)
pH: 6 (ref 5.0–8.0)

## 2018-06-13 LAB — CBC
HEMATOCRIT: 39.8 % (ref 36.0–46.0)
Hemoglobin: 12.8 g/dL (ref 12.0–15.0)
MCH: 27.5 pg (ref 26.0–34.0)
MCHC: 32.2 g/dL (ref 30.0–36.0)
MCV: 85.6 fL (ref 80.0–100.0)
Platelets: 224 10*3/uL (ref 150–400)
RBC: 4.65 MIL/uL (ref 3.87–5.11)
RDW: 13 % (ref 11.5–15.5)
WBC: 8.6 10*3/uL (ref 4.0–10.5)
nRBC: 0 % (ref 0.0–0.2)

## 2018-06-13 LAB — COMPREHENSIVE METABOLIC PANEL
ALT: 13 U/L (ref 0–44)
AST: 17 U/L (ref 15–41)
Albumin: 3.8 g/dL (ref 3.5–5.0)
Alkaline Phosphatase: 56 U/L (ref 38–126)
Anion gap: 9 (ref 5–15)
BUN: 13 mg/dL (ref 6–20)
CHLORIDE: 104 mmol/L (ref 98–111)
CO2: 24 mmol/L (ref 22–32)
Calcium: 8.9 mg/dL (ref 8.9–10.3)
Creatinine, Ser: 0.68 mg/dL (ref 0.44–1.00)
GFR calc Af Amer: >60 mL/min (ref >60)
Glucose, Bld: 98 mg/dL (ref 70–99)
Potassium: 4.3 mmol/L (ref 3.5–5.1)
Sodium: 137 mmol/L (ref 135–145)
Total Bilirubin: 0.6 mg/dL (ref 0.3–1.2)
Total Protein: 6.3 g/dL — ABNORMAL LOW (ref 6.5–8.1)

## 2018-06-13 LAB — I-STAT CG4 LACTIC ACID, ED: Lactic Acid, Venous: <0.3 mmol/L — ABNORMAL LOW (ref 0.5–1.9)

## 2018-06-13 LAB — LIPASE, BLOOD: LIPASE: 35 U/L (ref 11–51)

## 2018-06-13 LAB — WET PREP, GENITAL
Clue Cells Wet Prep HPF POC: NONE SEEN
Sperm: NONE SEEN
Trich, Wet Prep: NONE SEEN
Yeast Wet Prep HPF POC: NONE SEEN

## 2018-06-13 LAB — I-STAT BETA HCG BLOOD, ED (MC, WL, AP ONLY)

## 2018-06-13 SURGERY — APPENDECTOMY, LAPAROSCOPIC
Anesthesia: General

## 2018-06-13 SURGERY — REMOVAL PORT-A-CATH
Anesthesia: General | Laterality: Left

## 2018-06-13 MED ORDER — LACTATED RINGERS IV SOLN: INTRAVENOUS | Status: DC

## 2018-06-13 MED ORDER — PROCHLORPERAZINE EDISYLATE 10 MG/2ML IJ SOLN
5.0000 mg | Freq: Four times a day (QID) | INTRAMUSCULAR | Status: DC | PRN
  Administered 2018-06-15: 10 mg via INTRAVENOUS
  Filled 2018-06-13: qty 2

## 2018-06-13 MED ORDER — DEXAMETHASONE SODIUM PHOSPHATE 10 MG/ML IJ SOLN
INTRAMUSCULAR | Status: DC | PRN
  Administered 2018-06-13: 10 mg via INTRAVENOUS

## 2018-06-13 MED ORDER — 0.9 % SODIUM CHLORIDE (POUR BTL) OPTIME
TOPICAL | Status: DC | PRN
  Administered 2018-06-13: 1000 mL

## 2018-06-13 MED ORDER — DIPHENHYDRAMINE HCL 50 MG/ML IJ SOLN: 12.5000 mg | Freq: Four times a day (QID) | INTRAMUSCULAR | Status: DC | PRN

## 2018-06-13 MED ORDER — GLYCOPYRROLATE 0.2 MG/ML IJ SOLN
INTRAMUSCULAR | Status: DC | PRN
  Administered 2018-06-13: 0.2 mg via INTRAVENOUS

## 2018-06-13 MED ORDER — SODIUM CHLORIDE 0.9 % IV SOLN
2.0000 g | Freq: Once | INTRAVENOUS | Status: AC
  Administered 2018-06-13: 2 g via INTRAVENOUS
  Filled 2018-06-13: qty 20

## 2018-06-13 MED ORDER — ONDANSETRON HCL 4 MG/2ML IJ SOLN
INTRAMUSCULAR | Status: AC
  Filled 2018-06-13: qty 2

## 2018-06-13 MED ORDER — MORPHINE SULFATE (PF) 2 MG/ML IV SOLN
1.0000 mg | INTRAVENOUS | Status: DC | PRN
  Administered 2018-06-13: 2 mg via INTRAVENOUS

## 2018-06-13 MED ORDER — SCOPOLAMINE 1 MG/3DAYS TD PT72
MEDICATED_PATCH | TRANSDERMAL | Status: AC
  Filled 2018-06-13: qty 1

## 2018-06-13 MED ORDER — ONDANSETRON HCL 4 MG/2ML IJ SOLN
4.0000 mg | Freq: Once | INTRAMUSCULAR | Status: AC
  Administered 2018-06-13: 4 mg via INTRAVENOUS
  Filled 2018-06-13: qty 2

## 2018-06-13 MED ORDER — HYDROMORPHONE HCL 1 MG/ML IJ SOLN
INTRAMUSCULAR | Status: AC
  Filled 2018-06-13: qty 1

## 2018-06-13 MED ORDER — METRONIDAZOLE IN NACL 5-0.79 MG/ML-% IV SOLN
500.0000 mg | Freq: Once | INTRAVENOUS | Status: AC
  Administered 2018-06-13 (×2): 500 mg via INTRAVENOUS
  Filled 2018-06-13: qty 100

## 2018-06-13 MED ORDER — DIPHENHYDRAMINE HCL 12.5 MG/5ML PO ELIX: 12.5000 mg | ORAL_SOLUTION | Freq: Four times a day (QID) | ORAL | Status: DC | PRN

## 2018-06-13 MED ORDER — SUGAMMADEX SODIUM 200 MG/2ML IV SOLN
INTRAVENOUS | Status: DC | PRN
  Administered 2018-06-13: 100 mg via INTRAVENOUS

## 2018-06-13 MED ORDER — OXYCODONE HCL 5 MG PO TABS
5.0000 mg | ORAL_TABLET | ORAL | Status: DC | PRN
  Administered 2018-06-13 – 2018-06-14 (×2): 10 mg via ORAL
  Administered 2018-06-14: 5 mg via ORAL
  Administered 2018-06-14: 10 mg via ORAL
  Filled 2018-06-13: qty 1
  Filled 2018-06-13 (×3): qty 2

## 2018-06-13 MED ORDER — FLUTICASONE PROPIONATE 50 MCG/ACT NA SUSP
2.0000 | Freq: Every day | NASAL | Status: DC
  Filled 2018-06-13: qty 16

## 2018-06-13 MED ORDER — ROCURONIUM BROMIDE 10 MG/ML (PF) SYRINGE
PREFILLED_SYRINGE | INTRAVENOUS | Status: DC | PRN
  Administered 2018-06-13: 40 mg via INTRAVENOUS

## 2018-06-13 MED ORDER — LIDOCAINE 2% (20 MG/ML) 5 ML SYRINGE
INTRAMUSCULAR | Status: DC | PRN
  Administered 2018-06-13: 50 mg via INTRAVENOUS

## 2018-06-13 MED ORDER — EPHEDRINE SULFATE 50 MG/ML IJ SOLN
INTRAMUSCULAR | Status: DC | PRN
  Administered 2018-06-13 (×2): 10 mg via INTRAVENOUS

## 2018-06-13 MED ORDER — LIDOCAINE HCL 1 % IJ SOLN
INTRAMUSCULAR | Status: DC | PRN
  Administered 2018-06-13: 4 mL via INTRAMUSCULAR
  Administered 2018-06-13: 14 mL via INTRAMUSCULAR

## 2018-06-13 MED ORDER — PROPOFOL 10 MG/ML IV BOLUS
INTRAVENOUS | Status: DC | PRN
  Administered 2018-06-13: 130 mg via INTRAVENOUS

## 2018-06-13 MED ORDER — ENOXAPARIN SODIUM 40 MG/0.4ML ~~LOC~~ SOLN
40.0000 mg | SUBCUTANEOUS | Status: DC
  Administered 2018-06-14: 40 mg via SUBCUTANEOUS
  Filled 2018-06-13: qty 0.4

## 2018-06-13 MED ORDER — GABAPENTIN 300 MG PO CAPS
300.0000 mg | ORAL_CAPSULE | Freq: Two times a day (BID) | ORAL | Status: DC
  Administered 2018-06-13 – 2018-06-15 (×4): 300 mg via ORAL
  Filled 2018-06-13 (×4): qty 1

## 2018-06-13 MED ORDER — PROMETHAZINE HCL 25 MG/ML IJ SOLN: 6.2500 mg | INTRAMUSCULAR | Status: DC | PRN

## 2018-06-13 MED ORDER — ONDANSETRON HCL 4 MG/2ML IJ SOLN
INTRAMUSCULAR | Status: DC | PRN
  Administered 2018-06-13: 4 mg via INTRAVENOUS

## 2018-06-13 MED ORDER — ACETAMINOPHEN 650 MG RE SUPP: 650.0000 mg | Freq: Four times a day (QID) | RECTAL | Status: DC | PRN

## 2018-06-13 MED ORDER — FENTANYL CITRATE (PF) 100 MCG/2ML IJ SOLN
INTRAMUSCULAR | Status: DC | PRN
  Administered 2018-06-13: 100 ug via INTRAVENOUS
  Administered 2018-06-13: 50 ug via INTRAVENOUS
  Administered 2018-06-13: 25 ug via INTRAVENOUS

## 2018-06-13 MED ORDER — IOHEXOL 300 MG/ML  SOLN
100.0000 mL | Freq: Once | INTRAMUSCULAR | Status: AC | PRN
  Administered 2018-06-13: 100 mL via INTRAVENOUS

## 2018-06-13 MED ORDER — MORPHINE SULFATE (PF) 4 MG/ML IV SOLN
4.0000 mg | Freq: Once | INTRAVENOUS | Status: AC
  Administered 2018-06-13: 4 mg via INTRAVENOUS
  Filled 2018-06-13: qty 1

## 2018-06-13 MED ORDER — PROPOFOL 10 MG/ML IV BOLUS
INTRAVENOUS | Status: AC
  Filled 2018-06-13: qty 20

## 2018-06-13 MED ORDER — PROCHLORPERAZINE MALEATE 10 MG PO TABS
10.0000 mg | ORAL_TABLET | Freq: Four times a day (QID) | ORAL | Status: DC | PRN
  Filled 2018-06-13: qty 1

## 2018-06-13 MED ORDER — BISACODYL 5 MG PO TBEC: 5.0000 mg | DELAYED_RELEASE_TABLET | Freq: Every day | ORAL | Status: DC | PRN

## 2018-06-13 MED ORDER — KCL-LACTATED RINGERS-D5W 20 MEQ/L IV SOLN
INTRAVENOUS | Status: AC
  Administered 2018-06-13 – 2018-06-14 (×2): via INTRAVENOUS
  Filled 2018-06-13 (×2): qty 1000

## 2018-06-13 MED ORDER — LIDOCAINE HCL 1 % IJ SOLN
INTRAMUSCULAR | Status: AC
  Filled 2018-06-13: qty 20

## 2018-06-13 MED ORDER — BUPIVACAINE-EPINEPHRINE (PF) 0.25% -1:200000 IJ SOLN
INTRAMUSCULAR | Status: AC
  Filled 2018-06-13: qty 30

## 2018-06-13 MED ORDER — SIMETHICONE 80 MG PO CHEW: 40.0000 mg | CHEWABLE_TABLET | Freq: Four times a day (QID) | ORAL | Status: DC | PRN

## 2018-06-13 MED ORDER — SENNA 8.6 MG PO TABS
1.0000 | ORAL_TABLET | Freq: Two times a day (BID) | ORAL | Status: DC
  Administered 2018-06-13 – 2018-06-15 (×4): 8.6 mg via ORAL
  Filled 2018-06-13 (×4): qty 1

## 2018-06-13 MED ORDER — IBUPROFEN 600 MG PO TABS: 600.0000 mg | ORAL_TABLET | Freq: Four times a day (QID) | ORAL | Status: DC

## 2018-06-13 MED ORDER — SUCCINYLCHOLINE CHLORIDE 200 MG/10ML IV SOSY
PREFILLED_SYRINGE | INTRAVENOUS | Status: DC | PRN
  Administered 2018-06-13: 160 mg via INTRAVENOUS

## 2018-06-13 MED ORDER — ACETAMINOPHEN 325 MG PO TABS: 650.0000 mg | ORAL_TABLET | Freq: Four times a day (QID) | ORAL | Status: DC | PRN

## 2018-06-13 MED ORDER — METRONIDAZOLE IN NACL 5-0.79 MG/ML-% IV SOLN
500.0000 mg | Freq: Three times a day (TID) | INTRAVENOUS | Status: DC
  Administered 2018-06-13 – 2018-06-15 (×5): 500 mg via INTRAVENOUS
  Filled 2018-06-13 (×6): qty 100

## 2018-06-13 MED ORDER — METHOCARBAMOL 500 MG PO TABS
500.0000 mg | ORAL_TABLET | Freq: Four times a day (QID) | ORAL | Status: DC | PRN
  Administered 2018-06-15: 500 mg via ORAL
  Filled 2018-06-13: qty 1

## 2018-06-13 MED ORDER — KETOROLAC TROMETHAMINE 30 MG/ML IJ SOLN: 30.0000 mg | Freq: Four times a day (QID) | INTRAMUSCULAR | Status: DC | PRN

## 2018-06-13 MED ORDER — PHENYLEPHRINE HCL 10 MG/ML IJ SOLN
INTRAMUSCULAR | Status: DC | PRN
  Administered 2018-06-13: 120 ug via INTRAVENOUS
  Administered 2018-06-13: 80 ug via INTRAVENOUS

## 2018-06-13 MED ORDER — FENTANYL CITRATE (PF) 250 MCG/5ML IJ SOLN
INTRAMUSCULAR | Status: AC
  Filled 2018-06-13: qty 5

## 2018-06-13 MED ORDER — SODIUM CHLORIDE 0.9 % IV BOLUS
1000.0000 mL | Freq: Once | INTRAVENOUS | Status: AC
  Administered 2018-06-13: 1000 mL via INTRAVENOUS

## 2018-06-13 MED ORDER — MEPERIDINE HCL 50 MG/ML IJ SOLN: 6.2500 mg | INTRAMUSCULAR | Status: DC | PRN

## 2018-06-13 MED ORDER — ONDANSETRON 4 MG PO TBDP
4.0000 mg | ORAL_TABLET | Freq: Four times a day (QID) | ORAL | Status: DC | PRN
  Administered 2018-06-15: 4 mg via ORAL
  Filled 2018-06-13: qty 1

## 2018-06-13 MED ORDER — MIDAZOLAM HCL 5 MG/5ML IJ SOLN
INTRAMUSCULAR | Status: DC | PRN
  Administered 2018-06-13: 2 mg via INTRAVENOUS

## 2018-06-13 MED ORDER — LIDOCAINE 2% (20 MG/ML) 5 ML SYRINGE
INTRAMUSCULAR | Status: AC
  Filled 2018-06-13: qty 5

## 2018-06-13 MED ORDER — HYDROMORPHONE HCL 1 MG/ML IJ SOLN
0.2500 mg | INTRAMUSCULAR | Status: DC | PRN
  Administered 2018-06-13: 0.25 mg via INTRAVENOUS

## 2018-06-13 MED ORDER — MORPHINE SULFATE (PF) 2 MG/ML IV SOLN
INTRAVENOUS | Status: AC
  Filled 2018-06-13: qty 1

## 2018-06-13 MED ORDER — SODIUM CHLORIDE 0.9 % IV SOLN
2.0000 g | INTRAVENOUS | Status: DC
  Administered 2018-06-14: 2 g via INTRAVENOUS
  Filled 2018-06-13 (×2): qty 20

## 2018-06-13 MED ORDER — KETOROLAC TROMETHAMINE 30 MG/ML IJ SOLN
30.0000 mg | Freq: Four times a day (QID) | INTRAMUSCULAR | Status: AC
  Administered 2018-06-13 – 2018-06-14 (×4): 30 mg via INTRAVENOUS
  Filled 2018-06-13 (×4): qty 1

## 2018-06-13 MED ORDER — SODIUM CHLORIDE 0.9 % IR SOLN
Status: DC | PRN
  Administered 2018-06-13: 1000 mL

## 2018-06-13 MED ORDER — ONDANSETRON HCL 4 MG/2ML IJ SOLN
4.0000 mg | Freq: Four times a day (QID) | INTRAMUSCULAR | Status: DC | PRN
  Administered 2018-06-13 – 2018-06-14 (×4): 4 mg via INTRAVENOUS
  Filled 2018-06-13 (×5): qty 2

## 2018-06-13 MED ORDER — LACTATED RINGERS IV SOLN
INTRAVENOUS | Status: DC | PRN
  Administered 2018-06-13: 11:00:00 via INTRAVENOUS

## 2018-06-13 MED ORDER — MIDAZOLAM HCL 2 MG/2ML IJ SOLN
INTRAMUSCULAR | Status: AC
  Filled 2018-06-13: qty 2

## 2018-06-13 MED ORDER — SODIUM CHLORIDE 0.9 % IV SOLN
INTRAVENOUS | Status: DC
  Administered 2018-06-13: 10:00:00 via INTRAVENOUS

## 2018-06-13 MED ORDER — DEXAMETHASONE SODIUM PHOSPHATE 10 MG/ML IJ SOLN
INTRAMUSCULAR | Status: AC
  Filled 2018-06-13: qty 1

## 2018-06-13 SURGICAL SUPPLY — 42 items
APPLIER CLIP ROT 10 11.4 M/L (STAPLE) ×2
BLADE CLIPPER SURG (BLADE) IMPLANT
CANISTER SUCT 3000ML PPV (MISCELLANEOUS) ×2 IMPLANT
CHLORAPREP W/TINT 26ML (MISCELLANEOUS) ×2 IMPLANT
CLIP APPLIE ROT 10 11.4 M/L (STAPLE) ×1 IMPLANT
COVER SURGICAL LIGHT HANDLE (MISCELLANEOUS) ×2 IMPLANT
COVER WAND RF STERILE (DRAPES) ×2 IMPLANT
CUTTER FLEX LINEAR 45M (STAPLE) ×2 IMPLANT
DERMABOND ADVANCED (GAUZE/BANDAGES/DRESSINGS) ×1
DERMABOND ADVANCED .7 DNX12 (GAUZE/BANDAGES/DRESSINGS) ×1 IMPLANT
DRAPE WARM FLUID 44X44 (DRAPE) ×2 IMPLANT
ELECT REM PT RETURN 9FT ADLT (ELECTROSURGICAL) ×2
ELECTRODE REM PT RTRN 9FT ADLT (ELECTROSURGICAL) ×1 IMPLANT
ENDOLOOP SUT PDS II  0 18 (SUTURE)
ENDOLOOP SUT PDS II 0 18 (SUTURE) IMPLANT
GLOVE BIO SURGEON STRL SZ 6 (GLOVE) ×2 IMPLANT
GLOVE INDICATOR 6.5 STRL GRN (GLOVE) ×2 IMPLANT
GOWN STRL REUS W/ TWL LRG LVL3 (GOWN DISPOSABLE) ×2 IMPLANT
GOWN STRL REUS W/TWL 2XL LVL3 (GOWN DISPOSABLE) ×2 IMPLANT
GOWN STRL REUS W/TWL LRG LVL3 (GOWN DISPOSABLE) ×2
KIT BASIN OR (CUSTOM PROCEDURE TRAY) ×2 IMPLANT
KIT TURNOVER KIT B (KITS) ×2 IMPLANT
L-HOOK LAP DISP 36CM (ELECTROSURGICAL) ×2
LHOOK LAP DISP 36CM (ELECTROSURGICAL) ×1 IMPLANT
NS IRRIG 1000ML POUR BTL (IV SOLUTION) ×2 IMPLANT
PAD ARMBOARD 7.5X6 YLW CONV (MISCELLANEOUS) ×4 IMPLANT
PENCIL BUTTON HOLSTER BLD 10FT (ELECTRODE) ×2 IMPLANT
POUCH SPECIMEN RETRIEVAL 10MM (ENDOMECHANICALS) ×2 IMPLANT
RELOAD STAPLE TA45 3.5 REG BLU (ENDOMECHANICALS) ×2 IMPLANT
SET IRRIG TUBING LAPAROSCOPIC (IRRIGATION / IRRIGATOR) ×2 IMPLANT
SHEARS HARMONIC ACE PLUS 36CM (ENDOMECHANICALS) ×2 IMPLANT
SLEEVE ENDOPATH XCEL 5M (ENDOMECHANICALS) ×2 IMPLANT
SPECIMEN JAR SMALL (MISCELLANEOUS) ×2 IMPLANT
SUT MNCRL AB 4-0 PS2 18 (SUTURE) ×2 IMPLANT
TOWEL OR 17X24 6PK STRL BLUE (TOWEL DISPOSABLE) ×2 IMPLANT
TOWEL OR 17X26 10 PK STRL BLUE (TOWEL DISPOSABLE) ×2 IMPLANT
TRAY FOLEY CATH SILVER 16FR (SET/KITS/TRAYS/PACK) ×2 IMPLANT
TRAY LAPAROSCOPIC MC (CUSTOM PROCEDURE TRAY) ×2 IMPLANT
TROCAR XCEL BLUNT TIP 100MML (ENDOMECHANICALS) ×2 IMPLANT
TROCAR XCEL NON-BLD 5MMX100MML (ENDOMECHANICALS) ×2 IMPLANT
TUBING INSUFFLATION (TUBING) ×2 IMPLANT
WATER STERILE IRR 1000ML POUR (IV SOLUTION) ×2 IMPLANT

## 2018-06-13 NOTE — Anesthesia Postprocedure Evaluation (Signed)
Anesthesia Post Note  Patient: NAKYIA DAU  Procedure(s) Performed: APPENDECTOMY LAPAROSCOPIC (N/A )     Patient location during evaluation: PACU Anesthesia Type: General Level of consciousness: awake and alert Pain management: pain level controlled Vital Signs Assessment: post-procedure vital signs reviewed and stable Respiratory status: spontaneous breathing, nonlabored ventilation, respiratory function stable and patient connected to nasal cannula oxygen Cardiovascular status: blood pressure returned to baseline and stable Postop Assessment: no apparent nausea or vomiting Anesthetic complications: no    Last Vitals:  Vitals:   06/13/18 1300 06/13/18 1324  BP: (!) 100/58 (!) 99/59  Pulse: 61 (!) 57  Resp: 15 16  Temp: 36.5 C (!) 36.4 C  SpO2: 98% 100%    Last Pain:  Vitals:   06/13/18 1401  TempSrc:   PainSc: 8                  Shelton Silvas

## 2018-06-13 NOTE — Anesthesia Preprocedure Evaluation (Addendum)
Anesthesia Evaluation  Patient identified by MRN, date of birth, ID band Patient awake    Reviewed: Allergy & Precautions, NPO status , Patient's Chart, lab work & pertinent test results  Airway Mallampati: I  TM Distance: >3 FB Neck ROM: Full    Dental  (+) Teeth Intact, Dental Advisory Given   Pulmonary asthma ,    breath sounds clear to auscultation       Cardiovascular negative cardio ROS   Rhythm:Regular Rate:Normal     Neuro/Psych  Headaches, Seizures -,     GI/Hepatic negative GI ROS, Neg liver ROS,   Endo/Other  negative endocrine ROS  Renal/GU negative Renal ROS     Musculoskeletal negative musculoskeletal ROS (+)   Abdominal Normal abdominal exam  (+)   Peds  Hematology negative hematology ROS (+)   Anesthesia Other Findings   Reproductive/Obstetrics                            Lab Results  Component Value Date   WBC 8.6 06/13/2018   HGB 12.8 06/13/2018   HCT 39.8 06/13/2018   MCV 85.6 06/13/2018   PLT 224 06/13/2018   Lab Results  Component Value Date   CREATININE 0.68 06/13/2018   BUN 13 06/13/2018   NA 137 06/13/2018   K 4.3 06/13/2018   CL 104 06/13/2018   CO2 24 06/13/2018     Anesthesia Physical Anesthesia Plan  ASA: II  Anesthesia Plan: General   Post-op Pain Management:    Induction: Intravenous  PONV Risk Score and Plan: 4 or greater and Ondansetron, Dexamethasone, Midazolam and Scopolamine patch - Pre-op  Airway Management Planned: Oral ETT  Additional Equipment: None  Intra-op Plan:   Post-operative Plan: Extubation in OR  Informed Consent: I have reviewed the patients History and Physical, chart, labs and discussed the procedure including the risks, benefits and alternatives for the proposed anesthesia with the patient or authorized representative who has indicated his/her understanding and acceptance.   Dental advisory given  Plan  Discussed with: CRNA  Anesthesia Plan Comments:        Anesthesia Quick Evaluation

## 2018-06-13 NOTE — H&P (Signed)
Jenna Harrison is an 37 y.o. female.   Chief Complaint: abdominal pain HPI:  Pt is a 37 yo F who came to the ED with 3-4 days of progressively worsening lower abdominal pain.  She originally thought it was related to gas pain as she had an episode of drinking significantly more soda than normal one night.  She did not feel better after having gas or bowel movement.  She also has had nausea.  Pain has worsened on the right.  She denies fever, but did have shaking chills last night.     Past Medical History:  Diagnosis Date  . Asthma   . Headache    migraines  . Hx of varicella   . Seizures ([place])     Past Surgical History:  Procedure Laterality Date  . BONE MARROW HARVEST     with a bone cyst also removed  . CYST REMOVAL LEG     Knee  . DILATION AND EVACUATION    . HERNIA REPAIR  2011  . TONSILLECTOMY AND ADENOIDECTOMY  2001  . VAGINAL DELIVERY  2010  . WISDOM TOOTH EXTRACTION  2003    Family History  Problem Relation Age of Onset  . Cancer Mother        Brain,Lung  . Cancer Father        lung, prostate  . Seizures Sister   . Thyroid disease Maternal Aunt   . Stroke Maternal Grandmother    Social History:  reports that she has never smoked. She has never used smokeless tobacco. She reports that she drinks alcohol. She reports that she does not use drugs.  Allergies:  Allergies  Allergen Reactions  . Tramadol     Pass Out  . Sulfa Antibiotics Other (See Comments)    Childhood allergy    Medications Prior to Admission  Medication Sig Dispense Refill  . Collagen-Vitamin C (COLLAGEN PLUS VITAMIN C) 740-125 MG CAPS Take 1 capsule by mouth daily.    Marland Kitchen ELDERBERRY PO Take 1 tablet by mouth daily.    . fluticasone (FLONASE) 50 MCG/ACT nasal spray Place 2 sprays into both nostrils daily.    . Multiple Vitamin (MULTIVITAMIN WITH MINERALS) TABS tablet Take 1 tablet by mouth daily.    Marland Kitchen ibuprofen (ADVIL,MOTRIN) 600 MG tablet Take 1 tablet (600 mg total) by mouth every 6 (six)  hours. (Patient not taking: Reported on 06/13/2018) 30 tablet 0    Results for orders placed or performed during the hospital encounter of 06/13/18 (from the past 48 hour(s))  Urinalysis, Routine w reflex microscopic     Status: Abnormal   Collection Time: 06/13/18  3:51 AM  Result Value Ref Range   Color, Urine YELLOW YELLOW   APPearance CLEAR CLEAR   Specific Gravity, Urine 1.015 1.005 - 1.030   pH 6.0 5.0 - 8.0   Glucose, UA NEGATIVE NEGATIVE mg/dL   Hgb urine dipstick SMALL (A) NEGATIVE   Bilirubin Urine NEGATIVE NEGATIVE   Ketones, ur NEGATIVE NEGATIVE mg/dL   Protein, ur NEGATIVE NEGATIVE mg/dL   Nitrite NEGATIVE NEGATIVE   Leukocytes, UA NEGATIVE NEGATIVE   RBC / HPF 0-5 0 - 5 RBC/hpf   WBC, UA 0-5 0 - 5 WBC/hpf   Bacteria, UA RARE (A) NONE SEEN   Squamous Epithelial / LPF 0-5 0 - 5    Comment: Performed at [facility], [address], [place], [place] [address]  Lipase, blood     Status: None   Collection Time:  06/13/18  4:00 AM  Result Value Ref Range   Lipase 35 11 - 51 U/L    Comment: Performed at [facility], [place] [address], [place], [place] [address]  Comprehensive metabolic panel     Status: Abnormal   Collection Time: 06/13/18  4:00 AM  Result Value Ref Range   Sodium 137 135 - 145 mmol/L   Potassium 4.3 3.5 - 5.1 mmol/L   Chloride 104 98 - 111 mmol/L   CO2 24 22 - 32 mmol/L   Glucose, Bld 98 70 - 99 mg/dL   BUN 13 6 - 20 mg/dL   Creatinine, Ser 0.68 0.44 - 1.00 mg/dL   Calcium 8.9 8.9 - 10.3 mg/dL   Total Protein 6.3 (L) 6.5 - 8.1 g/dL   Albumin 3.8 3.5 - 5.0 g/dL   AST 17 15 - 41 U/L   ALT 13 0 - 44 U/L   Alkaline Phosphatase 56 38 - 126 U/L   Total Bilirubin 0.6 0.3 - 1.2 mg/dL   GFR calc non Af Amer >60 >60 mL/min   GFR calc Af Amer >60 >60 mL/min    Comment: (NOTE) The eGFR has been calculated using the CKD EPI equation. This calculation has not been validated in all clinical situations. eGFR's persistently <60 mL/min signify  possible Chronic Kidney Disease.    Anion gap 9 5 - 15    Comment: Performed at [facility] [address], [place], [place] [address]  CBC     Status: None   Collection Time: 06/13/18  4:00 AM  Result Value Ref Range   WBC 8.6 4.0 - 10.5 K/uL   RBC 4.65 3.87 - 5.11 MIL/uL   Hemoglobin 12.8 12.0 - 15.0 g/dL   HCT 39.8 36.0 - 46.0 %   MCV 85.6 80.0 - 100.0 fL   MCH 27.5 26.0 - 34.0 pg   MCHC 32.2 30.0 - 36.0 g/dL   RDW 13.0 11.5 - 15.5 %   Platelets 224 150 - 400 K/uL   nRBC 0.0 0.0 - 0.2 %    Comment: Performed at [facility], [place] [address] [place], [place] [address]  I-Stat beta hCG blood, ED     Status: None   Collection Time: 06/13/18  4:25 AM  Result Value Ref Range   I-stat hCG, quantitative <5.0 <5 mIU/mL   Comment 3            Comment:   GEST. AGE      CONC.  (mIU/mL)   <=1 WEEK        5 - 50     2 WEEKS       50 - 500     3 WEEKS       100 - 10,000     4 WEEKS     1,000 - 30,000        FEMALE AND NON-PREGNANT FEMALE:     LESS THAN 5 mIU/mL   Wet prep, genital     Status: Abnormal   Collection Time: 06/13/18  7:45 AM  Result Value Ref Range   Yeast Wet Prep HPF POC NONE SEEN NONE SEEN   Trich, Wet Prep NONE SEEN NONE SEEN   Clue Cells Wet Prep HPF POC NONE SEEN NONE SEEN   WBC, Wet Prep HPF POC MODERATE (A) NONE SEEN   Sperm NONE SEEN     Comment: Performed at [facility], [place] [address] [place], [place] [address]  I-Stat CG4 Lactic Acid, ED     Status: Abnormal   Collection Time: 06/13/18  7:46 AM  Result Value Ref Range   Lactic Acid, Venous <0.30 (L) 0.5 - 1.9 mmol/L   US Transvaginal Non-ob  Result Date: 06/13/2018 CLINICAL DATA:  Lower abdominal pain 4 days. EXAM: TRANSABDOMINAL AND TRANSVAGINAL ULTRASOUND OF PELVIS DOPPLER ULTRASOUND OF OVARIES TECHNIQUE: Both transabdominal and transvaginal ultrasound examinations of the pelvis were performed. Transabdominal technique was performed for global imaging of the pelvis including  uterus, ovaries, adnexal regions, and pelvic cul-de-sac. It was necessary to proceed with endovaginal exam following the transabdominal exam to visualize the endometrium and ovaries. Color and duplex Doppler ultrasound was utilized to evaluate blood flow to the ovaries. COMPARISON:  CT today as well as previous pelvic ultrasound 07/13/2012 FINDINGS: Uterus Measurements: 4.6 x 5.5 x 10.0 cm. No fibroids or other mass visualized. Endometrium Thickness: 9 mm.  No focal abnormality visualized. Right ovary Measurements: 2.8 x 2.8 x 3.4 cm. Normal appearance/no adnexal mass. Left ovary Measurements: 2.7 x 3.0 x 3.6 cm. Normal appearance/no adnexal mass. 2.4 cm dominant follicle. Pulsed Doppler evaluation of both ovaries demonstrates normal low-resistance arterial and venous waveforms. Other findings Small amount of free pelvic fluid. Somewhat prominent periuterine veins. IMPRESSION: Unremarkable pelvic ultrasound. No acute findings. No evidence of ovarian torsion. Electronically Signed   By: Marin Olp M.D.   On: 06/13/2018 07:44   US Pelvis Complete  Result Date: 06/13/2018 CLINICAL DATA:  Lower abdominal pain 4 days. EXAM: TRANSABDOMINAL AND TRANSVAGINAL ULTRASOUND OF PELVIS DOPPLER ULTRASOUND OF OVARIES TECHNIQUE: Both transabdominal and transvaginal ultrasound examinations of the pelvis were performed. Transabdominal technique was performed for global imaging of the pelvis including uterus, ovaries, adnexal regions, and pelvic cul-de-sac. It was necessary to proceed with endovaginal exam following the transabdominal exam to visualize the endometrium and ovaries. Color and duplex Doppler ultrasound was utilized to evaluate blood flow to the ovaries. COMPARISON:  CT today as well as previous pelvic ultrasound 07/13/2012 FINDINGS: Uterus Measurements: 4.6 x 5.5 x 10.0 cm. No fibroids or other mass visualized. Endometrium Thickness: 9 mm.  No focal abnormality visualized. Right ovary Measurements: 2.8 x 2.8 x 3.4  cm. Normal appearance/no adnexal mass. Left ovary Measurements: 2.7 x 3.0 x 3.6 cm. Normal appearance/no adnexal mass. 2.4 cm dominant follicle. Pulsed Doppler evaluation of both ovaries demonstrates normal low-resistance arterial and venous waveforms. Other findings Small amount of free pelvic fluid. Somewhat prominent periuterine veins. IMPRESSION: Unremarkable pelvic ultrasound. No acute findings. No evidence of ovarian torsion. Electronically Signed   By: Marin Olp M.D.   On: 06/13/2018 07:44   Ct Abdomen Pelvis W Contrast  Result Date: 06/13/2018 CLINICAL DATA:  Lower abdominal pain for 3 days. Nausea. Appendicitis suspected. EXAM: CT ABDOMEN AND PELVIS WITH CONTRAST TECHNIQUE: Multidetector CT imaging of the abdomen and pelvis was performed using the standard protocol following bolus administration of intravenous contrast. CONTRAST:  190m OMNIPAQUE IOHEXOL 300 MG/ML  SOLN COMPARISON:  Pelvic ultrasound of the same day. CT abdomen pelvis 07/16/2012 FINDINGS: Lower chest: The lung bases are clear without focal nodule, mass, or airspace disease. The heart size is normal. No significant pleural or pericardial effusion is present. Hepatobiliary: No focal liver abnormality is seen. No gallstones, gallbladder wall thickening, or biliary dilatation. Pancreas: Unremarkable. No pancreatic ductal dilatation or surrounding inflammatory changes. Spleen: Normal in size without focal abnormality. Adrenals/Urinary Tract: Adrenal glands are normal. Kidneys and ureters are within normal limits. The urinary bladder is unremarkable. Stomach/Bowel:  The stomach and duodenum are within normal limits. Small bowel is unremarkable. Terminal ileum is within normal limits. There is marked inflammation is enlargement of the appendix measuring up to 14 mm. No free air or abscess is evident. Inflammatory changes are present in the adjacent mesentery. The ascending and transverse colon are within normal limits. The descending and  sigmoid colon are normal. Vascular/Lymphatic: No significant vascular findings are present. No enlarged abdominal or pelvic lymph nodes. Reproductive: Uterus and bilateral adnexa are unremarkable. Other: No abdominal wall hernia or abnormality. No abdominopelvic ascites. Musculoskeletal: No acute or significant osseous findings. IMPRESSION: 1. Enlarged appendix with marked inflammatory changes consistent with acute appendicitis. No evidence for abscess or perforation. Electronically Signed   By: San Morelle M.D.   On: 06/13/2018 08:13   [place] Art/ven Flow Abd Pelv Doppler  Result Date: 06/13/2018 CLINICAL DATA:  Lower abdominal pain 4 days. EXAM: TRANSABDOMINAL AND TRANSVAGINAL ULTRASOUND OF PELVIS DOPPLER ULTRASOUND OF OVARIES TECHNIQUE: Both transabdominal and transvaginal ultrasound examinations of the pelvis were performed. Transabdominal technique was performed for global imaging of the pelvis including uterus, ovaries, adnexal regions, and pelvic cul-de-sac. It was necessary to proceed with endovaginal exam following the transabdominal exam to visualize the endometrium and ovaries. Color and duplex Doppler ultrasound was utilized to evaluate blood flow to the ovaries. COMPARISON:  CT today as well as previous pelvic ultrasound 07/13/2012 FINDINGS: Uterus Measurements: 4.6 x 5.5 x 10.0 cm. No fibroids or other mass visualized. Endometrium Thickness: 9 mm.  No focal abnormality visualized. Right ovary Measurements: 2.8 x 2.8 x 3.4 cm. Normal appearance/no adnexal mass. Left ovary Measurements: 2.7 x 3.0 x 3.6 cm. Normal appearance/no adnexal mass. 2.4 cm dominant follicle. Pulsed Doppler evaluation of both ovaries demonstrates normal low-resistance arterial and venous waveforms. Other findings Small amount of free pelvic fluid. Somewhat prominent periuterine veins. IMPRESSION: Unremarkable pelvic ultrasound. No acute findings. No evidence of ovarian torsion. Electronically Signed   By: Marin Olp  M.D.   On: 06/13/2018 07:44    Review of Systems  Constitutional: Negative.   HENT: Negative.   Eyes: Negative.   Respiratory: Negative.   Cardiovascular: Negative.   Gastrointestinal: Positive for abdominal pain and nausea.  Genitourinary: Negative.   Musculoskeletal: Negative.   Skin: Negative.   Neurological: Negative.   Endo/Heme/Allergies: Negative.   Psychiatric/Behavioral: Negative.   All other systems reviewed and are negative.   Blood pressure 114/73, pulse (!) 57, temperature 98 F (36.7 C), temperature source Oral, resp. rate 18, last menstrual period 06/06/2018, SpO2 100 %, unknown if currently breastfeeding. Physical Exam  Constitutional: She is oriented to person, place, and time. She appears well-developed and well-nourished. She appears distressed (looks uncomfortable).  HENT:  Head: Normocephalic and atraumatic.  Eyes: Pupils are equal, round, and reactive to light. Conjunctivae are normal. Right eye exhibits no discharge. Left eye exhibits no discharge. No scleral icterus.  Neck: Neck supple.  Cardiovascular: Normal rate and intact distal pulses.  Respiratory: Effort normal. No respiratory distress. She exhibits no tenderness.  GI: Soft. She exhibits distension (mild). There is tenderness (RLQ worst site). There is guarding.  Musculoskeletal: Normal range of motion.  Neurological: She is alert and oriented to person, place, and time. Coordination normal.  Skin: Skin is warm and dry. No rash noted. She is not diaphoretic. No erythema. No pallor.  Psychiatric: She has a normal mood and affect. Her behavior is normal. Judgment and thought content normal.     Assessment/Plan Acute appendicitis Plan lap appy.  IV antibiotics periop and +/- afterward depending on findings.    Appendectomy was described to the patient.  The incisions and surgical technique were explained.  The patient was advised that some of the hair on the abdomen would be clipped, and that a  foley catheter would be placed.  I advised the patient of the risks of surgery including, but not limited to, bleeding, infection, damage to other structures, risk of an open operation, risk of abscess, and risk of blood clot.  The recovery was also described to the patient.  Patient was advised that she will have lifting restrictions for 2 weeks.     Stark Klein, MD 06/13/2018, 10:05 AM

## 2018-06-13 NOTE — ED Provider Notes (Signed)
[facility] EMERGENCY DEPARTMENT Provider Note   CSN: [id number] Arrival date & time: 06/13/18  0346     History   Chief Complaint Chief Complaint  Patient presents with  . Abdominal Pain    HPI Jenna Harrison is a 36 y.o. female.  HPI  Patient is a 37 year old female with a history of asthma, headaches, asthma and allergic rhinitis presenting for lower abdominal pain.  Patient reports that she has had insidious onset of constant with intermittent sharpness lower abdominal pain, worse on right for the past 3 days.  Patient reports gradual onset.  Patient reports that it began after drinking 6 sodas, which is unusual for her.  Patient reports that she thought it was gas, but has been progressively worsening.  Patient reports that she has had normal bowel movements without melena or hematochezia, has felt nauseous particularly last night, but has not vomited.  Passing flatus normally. Patient reports slight increase in vaginal discharge but denies dysuria, urgency, or frequency.  Patient does have history of ovarian cyst rupture, and abdominal surgical history significant for bilateral inguinal hernia repair.  Patient is sexually active with one female partner, her spouse, denies STI concerns.  LMP 06-06-2018.  Past Medical History:  Diagnosis Date  . Asthma   . Headache    migraines  . Hx of varicella   . Seizures [facility])     Patient Active Problem List   Diagnosis Date Noted  . Pregnancy 03/17/2015  . Post-operative state 10/27/2012    Past Surgical History:  Procedure Laterality Date  . BONE MARROW HARVEST     with a bone cyst also removed  . CYST REMOVAL LEG     Knee  . DILATION AND EVACUATION    . HERNIA REPAIR  2011  . TONSILLECTOMY AND ADENOIDECTOMY  2001  . VAGINAL DELIVERY  2010  . WISDOM TOOTH EXTRACTION  2003     OB History    Gravida  3   Para  2   Term  2   Preterm      AB  1   Living  2     SAB  1   TAB      Ectopic        Multiple      Live Births  2            Home Medications    Prior to Admission medications   Medication Sig Start Date End Date Taking? Authorizing Provider  Collagen-Vitamin C (COLLAGEN PLUS VITAMIN C) 740-125 MG CAPS Take 1 capsule by mouth daily.   Yes [provider]  ELDERBERRY PO Take 1 tablet by mouth daily.   Yes [provider]  fluticasone (FLONASE) 50 MCG/ACT nasal spray Place 2 sprays into both nostrils daily.   Yes [provider]  Multiple Vitamin (MULTIVITAMIN WITH MINERALS) TABS tablet Take 1 tablet by mouth daily.   Yes [provider]  ibuprofen (ADVIL,MOTRIN) 600 MG tablet Take 1 tablet (600 mg total) by mouth every 6 (six) hours. Patient not taking: Reported on 06/13/2018 03/19/15   Candice Camp, MD    Family History Family History  Problem Relation Age of Onset  . Cancer Mother        Brain,Lung  . Cancer Father        lung, prostate  . Seizures Sister   . Thyroid disease Maternal Aunt   . Stroke Maternal Grandmother     Social History Social History  Tobacco Use  . Smoking status: Never Smoker  . Smokeless tobacco: Never Used  Substance Use Topics  . Alcohol use: Yes    Comment: Social  . Drug use: No     Allergies   Tramadol and Sulfa antibiotics   Review of Systems Review of Systems  Constitutional: Negative for chills and fever.  HENT: Negative for congestion and sore throat.   Respiratory: Negative for cough, chest tightness and shortness of breath.   Cardiovascular: Negative for chest pain.  Gastrointestinal: Positive for abdominal pain and nausea. Negative for blood in stool, diarrhea and vomiting.  Genitourinary: Positive for pelvic pain and vaginal discharge. Negative for dysuria, flank pain, hematuria, vaginal bleeding and vaginal pain.  Musculoskeletal: Negative for back pain and myalgias.  Skin: Negative for rash.  Neurological: Negative for dizziness, syncope and headaches.  All other  systems reviewed and are negative.    Physical Exam Updated Vital Signs BP 112/69 (BP Location: Right Arm)   Pulse 74   Temp 98 F (36.7 C) (Oral)   Resp 18   LMP 06/06/2018   SpO2 97%   Physical Exam  Constitutional: She appears well-developed and well-nourished. No distress.  HENT:  Head: Normocephalic and atraumatic.  Mouth/Throat: Oropharynx is clear and moist.  Eyes: Pupils are equal, round, and reactive to light. Conjunctivae and EOM are normal.  Neck: Normal range of motion. Neck supple.  Cardiovascular: Normal rate, regular rhythm, S1 normal and S2 normal.  No murmur heard. Pulmonary/Chest: Effort normal and breath sounds normal. She has no wheezes. She has no rales.  Abdominal: Soft. Normal appearance and bowel sounds are normal. She exhibits no distension. There is tenderness in the right lower quadrant, periumbilical area, suprapubic area and left lower quadrant. There is no rebound and no guarding.  Genitourinary:  Genitourinary Comments: Pelvic exam performed with nurse tech chaperone present.  Patient has no lymphadenopathy of the horizontal or vertical chains.  Normal vaginal tissue.  Moderate amount of thick white discharge surrounding cervical os.  No cervical friability or erythema. On bimanual exam, patient has discomfort to palpation of uterine fundus and bilateral adnexa, but no CMT.  Musculoskeletal: Normal range of motion. She exhibits no edema or deformity.  Lymphadenopathy:    She has no cervical adenopathy.  Neurological: She is alert.  Cranial nerves grossly intact. Patient moves extremities symmetrically and with good coordination.  Skin: Skin is warm and dry. No rash noted. No erythema.  Psychiatric: She has a normal mood and affect. Her behavior is normal. Judgment and thought content normal.  Nursing note and vitals reviewed.    ED Treatments / Results  Labs (all labs ordered are listed, but only abnormal results are displayed) Labs Reviewed    WET PREP, GENITAL - Abnormal; Notable for the following components:      Result Value   WBC, Wet Prep HPF POC MODERATE (*)    All other components within normal limits  COMPREHENSIVE METABOLIC PANEL - Abnormal; Notable for the following components:   Total Protein 6.3 (*)    All other components within normal limits  URINALYSIS, ROUTINE W REFLEX MICROSCOPIC - Abnormal; Notable for the following components:   Hgb urine dipstick SMALL (*)    Bacteria, UA RARE (*)    All other components within normal limits  I-STAT CG4 LACTIC ACID, ED - Abnormal; Notable for the following components:   Lactic Acid, Venous <0.30 (*)    All other components within normal limits  LIPASE, BLOOD  CBC  I-STAT BETA HCG BLOOD, ED ([facility], [facility], [facility] ONLY)  I-STAT CG4 LACTIC ACID, ED    EKG None  Radiology US Transvaginal Non-ob  Result Date: 06/13/2018 CLINICAL DATA:  Lower abdominal pain 4 days. EXAM: TRANSABDOMINAL AND TRANSVAGINAL ULTRASOUND OF PELVIS DOPPLER ULTRASOUND OF OVARIES TECHNIQUE: Both transabdominal and transvaginal ultrasound examinations of the pelvis were performed. Transabdominal technique was performed for global imaging of the pelvis including uterus, ovaries, adnexal regions, and pelvic cul-de-sac. It was necessary to proceed with endovaginal exam following the transabdominal exam to visualize the endometrium and ovaries. Color and duplex Doppler ultrasound was utilized to evaluate blood flow to the ovaries. COMPARISON:  CT today as well as previous pelvic ultrasound 07/13/2012 FINDINGS: Uterus Measurements: 4.6 x 5.5 x 10.0 cm. No fibroids or other mass visualized. Endometrium Thickness: 9 mm.  No focal abnormality visualized. Right ovary Measurements: 2.8 x 2.8 x 3.4 cm. Normal appearance/no adnexal mass. Left ovary Measurements: 2.7 x 3.0 x 3.6 cm. Normal appearance/no adnexal mass. 2.4 cm dominant follicle. Pulsed Doppler evaluation of both ovaries demonstrates normal low-resistance arterial and  venous waveforms. Other findings Small amount of free pelvic fluid. Somewhat prominent periuterine veins. IMPRESSION: Unremarkable pelvic ultrasound. No acute findings. No evidence of ovarian torsion. Electronically Signed   By: Elberta Fortis M.D.   On: 06/13/2018 07:44   US Pelvis Complete  Result Date: 06/13/2018 CLINICAL DATA:  Lower abdominal pain 4 days. EXAM: TRANSABDOMINAL AND TRANSVAGINAL ULTRASOUND OF PELVIS DOPPLER ULTRASOUND OF OVARIES TECHNIQUE: Both transabdominal and transvaginal ultrasound examinations of the pelvis were performed. Transabdominal technique was performed for global imaging of the pelvis including uterus, ovaries, adnexal regions, and pelvic cul-de-sac. It was necessary to proceed with endovaginal exam following the transabdominal exam to visualize the endometrium and ovaries. Color and duplex Doppler ultrasound was utilized to evaluate blood flow to the ovaries. COMPARISON:  CT today as well as previous pelvic ultrasound 07/13/2012 FINDINGS: Uterus Measurements: 4.6 x 5.5 x 10.0 cm. No fibroids or other mass visualized. Endometrium Thickness: 9 mm.  No focal abnormality visualized. Right ovary Measurements: 2.8 x 2.8 x 3.4 cm. Normal appearance/no adnexal mass. Left ovary Measurements: 2.7 x 3.0 x 3.6 cm. Normal appearance/no adnexal mass. 2.4 cm dominant follicle. Pulsed Doppler evaluation of both ovaries demonstrates normal low-resistance arterial and venous waveforms. Other findings Small amount of free pelvic fluid. Somewhat prominent periuterine veins. IMPRESSION: Unremarkable pelvic ultrasound. No acute findings. No evidence of ovarian torsion. Electronically Signed   By: Elberta Fortis M.D.   On: 06/13/2018 07:44   Ct Abdomen Pelvis W Contrast  Result Date: 06/13/2018 CLINICAL DATA:  Lower abdominal pain for 3 days. Nausea. Appendicitis suspected. EXAM: CT ABDOMEN AND PELVIS WITH CONTRAST TECHNIQUE: Multidetector CT imaging of the abdomen and pelvis was performed using  the standard protocol following bolus administration of intravenous contrast. CONTRAST:  OMNIPAQUE IOHEXOL 300 MG/ML  SOLN COMPARISON:  Pelvic ultrasound of the same day. CT abdomen pelvis 07/16/2012 FINDINGS: Lower chest: The lung bases are clear without focal nodule, mass, or airspace disease. The heart size is normal. No significant pleural or pericardial effusion is present. Hepatobiliary: No focal liver abnormality is seen. No gallstones, gallbladder wall thickening, or biliary dilatation. Pancreas: Unremarkable. No pancreatic ductal dilatation or surrounding inflammatory changes. Spleen: Normal in size without focal abnormality. Adrenals/Urinary Tract: Adrenal glands are normal. Kidneys and ureters are within normal limits. The urinary bladder is unremarkable. Stomach/Bowel: The stomach and duodenum are within normal limits. Small bowel is unremarkable. Terminal ileum is  within normal limits. There is marked inflammation is enlargement of the appendix measuring up to 14 mm. No free air or abscess is evident. Inflammatory changes are present in the adjacent mesentery. The ascending and transverse colon are within normal limits. The descending and sigmoid colon are normal. Vascular/Lymphatic: No significant vascular findings are present. No enlarged abdominal or pelvic lymph nodes. Reproductive: Uterus and bilateral adnexa are unremarkable. Other: No abdominal wall hernia or abnormality. No abdominopelvic ascites. Musculoskeletal: No acute or significant osseous findings. IMPRESSION: 1. Enlarged appendix with marked inflammatory changes consistent with acute appendicitis. No evidence for abscess or perforation. Electronically Signed   By: Marin Roberts M.D.   On: 06/13/2018 08:13   [place] Art/ven Flow Abd Pelv Doppler  Result Date: 06/13/2018 CLINICAL DATA:  Lower abdominal pain 4 days. EXAM: TRANSABDOMINAL AND TRANSVAGINAL ULTRASOUND OF PELVIS DOPPLER ULTRASOUND OF OVARIES TECHNIQUE: Both  transabdominal and transvaginal ultrasound examinations of the pelvis were performed. Transabdominal technique was performed for global imaging of the pelvis including uterus, ovaries, adnexal regions, and pelvic cul-de-sac. It was necessary to proceed with endovaginal exam following the transabdominal exam to visualize the endometrium and ovaries. Color and duplex Doppler ultrasound was utilized to evaluate blood flow to the ovaries. COMPARISON:  CT today as well as previous pelvic ultrasound 07/13/2012 FINDINGS: Uterus Measurements: 4.6 x 5.5 x 10.0 cm. No fibroids or other mass visualized. Endometrium Thickness: 9 mm.  No focal abnormality visualized. Right ovary Measurements: 2.8 x 2.8 x 3.4 cm. Normal appearance/no adnexal mass. Left ovary Measurements: 2.7 x 3.0 x 3.6 cm. Normal appearance/no adnexal mass. 2.4 cm dominant follicle. Pulsed Doppler evaluation of both ovaries demonstrates normal low-resistance arterial and venous waveforms. Other findings Small amount of free pelvic fluid. Somewhat prominent periuterine veins. IMPRESSION: Unremarkable pelvic ultrasound. No acute findings. No evidence of ovarian torsion. Electronically Signed   By: Elberta Fortis M.D.   On: 06/13/2018 07:44    Procedures Procedures (including critical care time)  Medications Ordered in ED Medications  iohexol (OMNIPAQUE) 300 MG/ML solution 100 mL (has no administration in time range)  morphine 4 MG/ML injection 4 mg (4 mg Intravenous Given 06/13/18 0634)  ondansetron (ZOFRAN) injection 4 mg (4 mg Intravenous Given 06/13/18 1610)     Initial Impression / Assessment and Plan / ED Course  I have reviewed the triage vital signs and the nursing notes.  Pertinent labs & imaging results that were available during my care of the patient were reviewed by me and considered in my medical decision making (see chart for details).  Clinical Course as of Jun 13 910  Sat Jun 13, 2018  9604 No evidence of torsion or free  fluid.  US Pelvis Complete [AM]  0747 Doubt incarcerated hernia.  Lactic Acid, Venous(!): <0.30 [AM]  0816 Acute appy called by radiologist. Will page surgery and admit.   [AM]  [phone] Spoke with Dr. Donell Beers who will evaluate patient.  I appreciate her involvement in the care of this patient.   [AM]    Clinical Course User Index [AM] Elisha Ponder, PA-C    Patient is nontoxic-appearing, afebrile, and hemodynamically stable.  Differential diagnosis includes appendicitis, nephrolithiasis, ovarian torsion, PID, incarcerated inguinal hernia, small bowel obstruction, diverticulitis, ovarian cyst rupture.  Work-up with no leukocytosis.  Normal lactic acid.  Urinalysis without acute evidence of infection.  Patient is not pregnant.  Wet prep with moderate WBCs, but no other acute findings.    CT abdomen and pelvis demonstrates acute appendicitis with 14  mm dilation of the appendix with surrounding inflammatory changes, but no evidence of abscess or rupture.  Pelvic ultrasound without acute findings.  Surgery consulted to evaluate patient.  Appreciate their involvement.  Patient updated on plan of care.  This is a shared visit with Dr. Virgina Norfolk. Patient was independently evaluated by this attending physician. Attending physician consulted in evaluation and management.  Final Clinical Impressions(s) / ED Diagnoses   Final diagnoses:  Acute appendicitis, unspecified acute appendicitis type  Microscopic hematuria    ED Discharge Orders    None       Delia Chimes 06/13/18 0914    Virgina Norfolk, DO 06/13/18 1933

## 2018-06-13 NOTE — Op Note (Signed)
Appendectomy, Lap, Procedure Note  Indications: The patient presented with a history of right-sided abdominal pain. A Ct revealed findings consistent with acute appendicitis.  Pre-operative Diagnosis: acute appendicitis  Post-operative Diagnosis: Acute supporative appendicitis  Surgeon: Almond Lint   Assistants: n/a  Anesthesia: General endotracheal anesthesia and Local anesthesia 1% plain lidocaine, 0.25.% bupivacaine, with epinephrine  ASA Class: 2  Procedure Details  The patient was seen again in the Holding Room. The risks, benefits, complications, treatment options, and expected outcomes were discussed with the patient and/or family. The possibilities of perforation of viscus, bleeding, recurrent infection, the need for additional procedures, failure to diagnose a condition, and creating a complication requiring transfusion or operation were discussed. There was concurrence with the proposed plan and informed consent was obtained. The site of surgery was properly noted. The patient was taken to Operating Room, identified as Jenna Harrison and the procedure verified as Appendectomy. A Time Out was held and the above information confirmed.  The patient was placed in the supine position and general anesthesia was induced, along with placement of orogastric tube, Venodyne boots, and a Foley catheter. The abdomen was prepped and draped in a sterile fashion. Local anesthetic was infiltrated in the infraumbilical region.  A 1.5 cm vertical incision was made just below the umbilicus.  The Kelly clamp was used to spread the subcutaneous tissues.  The fascia was elevated with 2 Kocher clamps and incised with the #11 blade.  A Tresa Endo was used to confirm entrance into the peritoneal cavity.  A pursestring suture was placed around the fascial incision.  The Hasson trocar was inserted into the abdomen and held in place with the tails of the suture.  The pneumoperitoneum was then established to steady  pressure of 15 mmHg.     Additional 5 mm cannulas then placed in the left lower quadrant of the abdomen and the suprapubic region under direct visualization.  A careful evaluation of the entire abdomen was carried out. The patient was placed in Trendelenburg and rotated to the left.  The small intestines were retracted in the cephalad and left lateral direction away from the pelvis and right lower quadrant. The patient was found to have an enlarged and inflamed appendix that was under the terminal ileum. There was no evidence of perforation, but there was supporative fluid just around the appendix.  The appendix was carefully dissected. The appendix was was skeletonized with the harmonic scalpel.   The appendix was divided at its base using an endo-GIA stapler. No appendiceal stump was left in place. The appendix was removed from the abdomen with an Endocatch bag through the umbilical port.  There was no evidence of bleeding, leakage, or complication after division of the appendix. Irrigation was also performed and irrigate suctioned from the abdomen as well.  A four quadrant inspection showed no evidence of bleeding or succus.  The 5 mm trocars were removed.  The pneumoperitoneum was evacuated from the abdomen.    The trocar site skin wounds were closed with 4-0 Monocryl and dressed with Dermabond.  Instrument, sponge, and needle counts were correct at the conclusion of the case.   Findings: The appendix was found to be inflamed with small amount supporative fluid just adjacent to the appendix There were not signs of necrosis.  There was not perforation. There was not abscess formation.   Normal appearing uterus, fallopian tubes, ovaries, gallbladder, liver, stomach, intestines.  No evidence of hernia recurrence.    Estimated Blood Loss:  Minimal  Specimens: appendix to pathology         Complications:  None; patient tolerated the procedure well.         Disposition: PACU -  hemodynamically stable.         Condition: stable

## 2018-06-13 NOTE — Transfer of Care (Signed)
Immediate Anesthesia Transfer of Care Note  Patient: Jenna Harrison  Procedure(s) Performed: APPENDECTOMY LAPAROSCOPIC (N/A )  Patient Location: PACU  Anesthesia Type:General  Level of Consciousness: awake, alert , oriented and patient cooperative  Airway & Oxygen Therapy: Patient Spontanous Breathing  Post-op Assessment: Report given to RN and Post -op Vital signs reviewed and stable  Post vital signs: Reviewed and stable  Last Vitals:  Vitals Value Taken Time  BP 114/54 06/13/2018 11:44 AM  Temp    Pulse 78 06/13/2018 11:45 AM  Resp 11 06/13/2018 11:45 AM  SpO2 100 % 06/13/2018 11:45 AM  Vitals shown include unvalidated device data.  Last Pain:  Vitals:   06/13/18 0353  TempSrc:   PainSc: 9          Complications: No apparent anesthesia complications

## 2018-06-13 NOTE — ED Provider Notes (Signed)
Medical screening examination/treatment/procedure(s) were conducted as a shared visit with non-physician practitioner(s) and myself.  I personally evaluated the patient during the encounter. Briefly, the patient is a 37 y.o. female with no significant medical history who presents to the ED with lower abdominal pain, nausea.  Patient with unremarkable vitals.  Patient with symptoms for the last several days.  Was seen in urgent care recently with unremarkable work-up.  Patient has had nausea.  No urinary symptoms.  No vomiting.  Denies any vaginal symptoms.  Patient with tenderness in the lower quadrants on exam.  Work-up showed no significant anemia, electrolyte abnormality, kidney injury.  Lipase within normal limits.  Urinalysis unremarkable.  Pelvic ultrasound did not show any signs of torsion or ovarian cyst.  Given ongoing pain CT and abdomen and pelvis was ordered that showed acute appendicitis.  Patient admitted to surgery.  Patient was given IV fluids and IV pain medicine and IV antiemetics with improvement of pain.  Hemodynamically stable throughout my care.   EKG Interpretation None           Virgina Norfolk, DO 06/13/18 4098

## 2018-06-13 NOTE — Anesthesia Procedure Notes (Signed)
Procedure Name: Intubation Date/Time: 06/13/2018 10:44 AM Performed by: Verdie Drown, CRNA Pre-anesthesia Checklist: Patient identified, Emergency Drugs available, Suction available and Patient being monitored Patient Re-evaluated:Patient Re-evaluated prior to induction Oxygen Delivery Method: Circle System Utilized Preoxygenation: Pre-oxygenation with 100% oxygen Induction Type: IV induction Ventilation: Mask ventilation without difficulty Laryngoscope Size: Mac and 3 Grade View: Grade I Tube type: Oral Tube size: 7.0 mm Number of attempts: 1 Airway Equipment and Method: Stylet and Oral airway Placement Confirmation: ETT inserted through vocal cords under direct vision,  positive ETCO2 and breath sounds checked- equal and bilateral Secured at: 22 cm Tube secured with: Tape Dental Injury: Teeth and Oropharynx as per pre-operative assessment

## 2018-06-13 NOTE — ED Triage Notes (Signed)
C/o generalized abd cramping with nausea since Wednesday.  Seen at [facility] on Thursday for same.  Denies urinary complaints.

## 2018-06-14 ENCOUNTER — Encounter (HOSPITAL_COMMUNITY): Payer: Self-pay | Admitting: General Surgery

## 2018-06-14 ENCOUNTER — Other Ambulatory Visit: Payer: Self-pay

## 2018-06-14 LAB — CBC
HCT: 33.5 % — ABNORMAL LOW (ref 36.0–46.0)
HEMOGLOBIN: 10.6 g/dL — AB (ref 12.0–15.0)
MCH: 27.1 pg (ref 26.0–34.0)
MCHC: 31.6 g/dL (ref 30.0–36.0)
MCV: 85.7 fL (ref 80.0–100.0)
NRBC: 0 % (ref 0.0–0.2)
PLATELETS: 169 10*3/uL (ref 150–400)
RBC: 3.91 MIL/uL (ref 3.87–5.11)
RDW: 13.2 % (ref 11.5–15.5)
WBC: 7.7 10*3/uL (ref 4.0–10.5)

## 2018-06-14 LAB — BASIC METABOLIC PANEL
Anion gap: 7 (ref 5–15)
BUN: 5 mg/dL — ABNORMAL LOW (ref 6–20)
CHLORIDE: 106 mmol/L (ref 98–111)
CO2: 23 mmol/L (ref 22–32)
Calcium: 8 mg/dL — ABNORMAL LOW (ref 8.9–10.3)
Creatinine, Ser: 0.58 mg/dL (ref 0.44–1.00)
GFR calc Af Amer: >60 mL/min (ref >60)
GLUCOSE: 166 mg/dL — AB (ref 70–99)
POTASSIUM: 4 mmol/L (ref 3.5–5.1)
SODIUM: 136 mmol/L (ref 135–145)

## 2018-06-14 NOTE — Progress Notes (Signed)
1 Day Post-Op   Subjective/Chief Complaint: No complaints other than soreness   Objective: Vital signs in last 24 hours: Temp:  [97.5 F (36.4 C)-98.2 F (36.8 C)] 98.2 F (36.8 C) (10/20 0501) Pulse Rate:  [42-84] 42 (10/20 0501) Resp:  [13-16] 16 (10/20 0501) BP: (77-114)/(41-59) 88/45 (10/20 0501) SpO2:  [97 %-100 %] 99 % (10/20 0501)    Intake/Output from previous day: 10/19 0701 - 10/20 0700 In: 2865.7 [I.V.:2561.1; IV Piggyback:304.6] Out: 1840 [Urine:1800; Blood:40] Intake/Output this shift: No intake/output data recorded.  General appearance: alert and cooperative Resp: clear to auscultation bilaterally Cardio: regular rate and rhythm GI: soft, mild tenderness  Lab Results:  Recent Labs    06/13/18 0400 06/14/18 0232  WBC 8.6 7.7  HGB 12.8 10.6*  HCT 39.8 33.5*  PLT 224 169   BMET Recent Labs    06/13/18 0400 06/14/18 0232  NA 137 136  K 4.3 4.0  CL 104 106  CO2 24 23  GLUCOSE 98 166*  BUN 13 5*  CREATININE 0.68 0.58  CALCIUM 8.9 8.0*   PT/INR No results for input(s): LABPROT, INR in the last 72 hours. ABG No results for input(s): PHART, HCO3 in the last 72 hours.  Invalid input(s): PCO2, PO2  Studies/Results: US Transvaginal Non-ob  Result Date: 06/13/2018 CLINICAL DATA:  Lower abdominal pain 4 days. EXAM: TRANSABDOMINAL AND TRANSVAGINAL ULTRASOUND OF PELVIS DOPPLER ULTRASOUND OF OVARIES TECHNIQUE: Both transabdominal and transvaginal ultrasound examinations of the pelvis were performed. Transabdominal technique was performed for global imaging of the pelvis including uterus, ovaries, adnexal regions, and pelvic cul-de-sac. It was necessary to proceed with endovaginal exam following the transabdominal exam to visualize the endometrium and ovaries. Color and duplex Doppler ultrasound was utilized to evaluate blood flow to the ovaries. COMPARISON:  CT today as well as previous pelvic ultrasound 07/13/2012 FINDINGS: Uterus Measurements: 4.6 x 5.5  x 10.0 cm. No fibroids or other mass visualized. Endometrium Thickness: 9 mm.  No focal abnormality visualized. Right ovary Measurements: 2.8 x 2.8 x 3.4 cm. Normal appearance/no adnexal mass. Left ovary Measurements: 2.7 x 3.0 x 3.6 cm. Normal appearance/no adnexal mass. 2.4 cm dominant follicle. Pulsed Doppler evaluation of both ovaries demonstrates normal low-resistance arterial and venous waveforms. Other findings Small amount of free pelvic fluid. Somewhat prominent periuterine veins. IMPRESSION: Unremarkable pelvic ultrasound. No acute findings. No evidence of ovarian torsion. Electronically Signed   By: Elberta Fortis M.D.   On: 06/13/2018 07:44   US Pelvis Complete  Result Date: 06/13/2018 CLINICAL DATA:  Lower abdominal pain 4 days. EXAM: TRANSABDOMINAL AND TRANSVAGINAL ULTRASOUND OF PELVIS DOPPLER ULTRASOUND OF OVARIES TECHNIQUE: Both transabdominal and transvaginal ultrasound examinations of the pelvis were performed. Transabdominal technique was performed for global imaging of the pelvis including uterus, ovaries, adnexal regions, and pelvic cul-de-sac. It was necessary to proceed with endovaginal exam following the transabdominal exam to visualize the endometrium and ovaries. Color and duplex Doppler ultrasound was utilized to evaluate blood flow to the ovaries. COMPARISON:  CT today as well as previous pelvic ultrasound 07/13/2012 FINDINGS: Uterus Measurements: 4.6 x 5.5 x 10.0 cm. No fibroids or other mass visualized. Endometrium Thickness: 9 mm.  No focal abnormality visualized. Right ovary Measurements: 2.8 x 2.8 x 3.4 cm. Normal appearance/no adnexal mass. Left ovary Measurements: 2.7 x 3.0 x 3.6 cm. Normal appearance/no adnexal mass. 2.4 cm dominant follicle. Pulsed Doppler evaluation of both ovaries demonstrates normal low-resistance arterial and venous waveforms. Other findings Small amount of free pelvic fluid. Somewhat prominent  periuterine veins. IMPRESSION: Unremarkable pelvic  ultrasound. No acute findings. No evidence of ovarian torsion. Electronically Signed   By: Elberta Fortis M.D.   On: 06/13/2018 07:44   Ct Abdomen Pelvis W Contrast  Result Date: 06/13/2018 CLINICAL DATA:  Lower abdominal pain for 3 days. Nausea. Appendicitis suspected. EXAM: CT ABDOMEN AND PELVIS WITH CONTRAST TECHNIQUE: Multidetector CT imaging of the abdomen and pelvis was performed using the standard protocol following bolus administration of intravenous contrast. CONTRAST:  OMNIPAQUE IOHEXOL 300 MG/ML  SOLN COMPARISON:  Pelvic ultrasound of the same day. CT abdomen pelvis 07/16/2012 FINDINGS: Lower chest: The lung bases are clear without focal nodule, mass, or airspace disease. The heart size is normal. No significant pleural or pericardial effusion is present. Hepatobiliary: No focal liver abnormality is seen. No gallstones, gallbladder wall thickening, or biliary dilatation. Pancreas: Unremarkable. No pancreatic ductal dilatation or surrounding inflammatory changes. Spleen: Normal in size without focal abnormality. Adrenals/Urinary Tract: Adrenal glands are normal. Kidneys and ureters are within normal limits. The urinary bladder is unremarkable. Stomach/Bowel: The stomach and duodenum are within normal limits. Small bowel is unremarkable. Terminal ileum is within normal limits. There is marked inflammation is enlargement of the appendix measuring up to 14 mm. No free air or abscess is evident. Inflammatory changes are present in the adjacent mesentery. The ascending and transverse colon are within normal limits. The descending and sigmoid colon are normal. Vascular/Lymphatic: No significant vascular findings are present. No enlarged abdominal or pelvic lymph nodes. Reproductive: Uterus and bilateral adnexa are unremarkable. Other: No abdominal wall hernia or abnormality. No abdominopelvic ascites. Musculoskeletal: No acute or significant osseous findings. IMPRESSION: 1. Enlarged appendix with  marked inflammatory changes consistent with acute appendicitis. No evidence for abscess or perforation. Electronically Signed   By: Marin Roberts M.D.   On: 06/13/2018 08:13   [place] Art/ven Flow Abd Pelv Doppler  Result Date: 06/13/2018 CLINICAL DATA:  Lower abdominal pain 4 days. EXAM: TRANSABDOMINAL AND TRANSVAGINAL ULTRASOUND OF PELVIS DOPPLER ULTRASOUND OF OVARIES TECHNIQUE: Both transabdominal and transvaginal ultrasound examinations of the pelvis were performed. Transabdominal technique was performed for global imaging of the pelvis including uterus, ovaries, adnexal regions, and pelvic cul-de-sac. It was necessary to proceed with endovaginal exam following the transabdominal exam to visualize the endometrium and ovaries. Color and duplex Doppler ultrasound was utilized to evaluate blood flow to the ovaries. COMPARISON:  CT today as well as previous pelvic ultrasound 07/13/2012 FINDINGS: Uterus Measurements: 4.6 x 5.5 x 10.0 cm. No fibroids or other mass visualized. Endometrium Thickness: 9 mm.  No focal abnormality visualized. Right ovary Measurements: 2.8 x 2.8 x 3.4 cm. Normal appearance/no adnexal mass. Left ovary Measurements: 2.7 x 3.0 x 3.6 cm. Normal appearance/no adnexal mass. 2.4 cm dominant follicle. Pulsed Doppler evaluation of both ovaries demonstrates normal low-resistance arterial and venous waveforms. Other findings Small amount of free pelvic fluid. Somewhat prominent periuterine veins. IMPRESSION: Unremarkable pelvic ultrasound. No acute findings. No evidence of ovarian torsion. Electronically Signed   By: Elberta Fortis M.D.   On: 06/13/2018 07:44    Anti-infectives: Anti-infectives (From admission, onward)   Start     Dose/Rate Route Frequency Ordered Stop   06/14/18 0900  cefTRIAXone (ROCEPHIN) 2 g in sodium chloride 0.9 % 100 mL IVPB     2 g 200 mL/hr over 30 Minutes Intravenous Every 24 hours 06/13/18 1327     06/13/18 1830  metroNIDAZOLE (FLAGYL) IVPB 500 mg     500  mg 100 mL/hr over 60  Minutes Intravenous Every 8 hours 06/13/18 1327     06/13/18 0830  cefTRIAXone (ROCEPHIN) 2 g in sodium chloride 0.9 % 100 mL IVPB     2 g 200 mL/hr over 30 Minutes Intravenous  Once 06/13/18 0817 06/13/18 1000   06/13/18 0830  metroNIDAZOLE (FLAGYL) IVPB 500 mg     500 mg 100 mL/hr over 60 Minutes Intravenous  Once 06/13/18 0817 06/13/18 1045      Assessment/Plan: s/p Procedure(s): APPENDECTOMY LAPAROSCOPIC (N/A) Advance diet  Continue IV abx another day then consider switching to orals Ambulate POD 1  LOS: 0 days    Chevis Pretty III 06/14/2018

## 2018-06-14 NOTE — Progress Notes (Signed)
Informed Dr. Carolynne Edouard of pt's low BP and pulse.Pt is alert,oriented;denies dizziness;reports some nausea, no vomiting. Pt says her BP is low because she "runs a lot." No new orders.

## 2018-06-15 LAB — CBC
HEMATOCRIT: 32.2 % — AB (ref 36.0–46.0)
Hemoglobin: 10.2 g/dL — ABNORMAL LOW (ref 12.0–15.0)
MCH: 27.3 pg (ref 26.0–34.0)
MCHC: 31.7 g/dL (ref 30.0–36.0)
MCV: 86.3 fL (ref 80.0–100.0)
PLATELETS: 153 10*3/uL (ref 150–400)
RBC: 3.73 MIL/uL — ABNORMAL LOW (ref 3.87–5.11)
RDW: 13.6 % (ref 11.5–15.5)
WBC: 4.6 10*3/uL (ref 4.0–10.5)
nRBC: 0 % (ref 0.0–0.2)

## 2018-06-15 LAB — BASIC METABOLIC PANEL
Anion gap: 6 (ref 5–15)
BUN: 7 mg/dL (ref 6–20)
CALCIUM: 8.1 mg/dL — AB (ref 8.9–10.3)
CO2: 26 mmol/L (ref 22–32)
CREATININE: 0.69 mg/dL (ref 0.44–1.00)
Chloride: 109 mmol/L (ref 98–111)
GFR calc Af Amer: >60 mL/min (ref >60)
GFR calc non Af Amer: >60 mL/min (ref >60)
GLUCOSE: 94 mg/dL (ref 70–99)
Potassium: 4.2 mmol/L (ref 3.5–5.1)
Sodium: 141 mmol/L (ref 135–145)

## 2018-06-15 LAB — GC/CHLAMYDIA PROBE AMP (~~LOC~~) NOT AT ARMC
Chlamydia: NEGATIVE
Neisseria Gonorrhea: NEGATIVE

## 2018-06-15 MED ORDER — AMOXICILLIN-POT CLAVULANATE 875-125 MG PO TABS: 1.0000 | ORAL_TABLET | Freq: Two times a day (BID) | ORAL | 0 refills | Status: AC

## 2018-06-15 MED ORDER — OXYCODONE HCL 5 MG PO TABS: 5.0000 mg | ORAL_TABLET | Freq: Four times a day (QID) | ORAL | 0 refills | Status: DC | PRN

## 2018-06-15 MED ORDER — AMOXICILLIN-POT CLAVULANATE 875-125 MG PO TABS
1.0000 | ORAL_TABLET | Freq: Two times a day (BID) | ORAL | Status: DC
  Administered 2018-06-15: 1 via ORAL
  Filled 2018-06-15: qty 1

## 2018-06-15 NOTE — Discharge Instructions (Signed)

## 2018-06-15 NOTE — Discharge Summary (Signed)
[facility]/Trauma Discharge Summary   Patient ID: Jenna Harrison MRN: [id number] DOB/AGE: 12-18-1980 37 y.o.  Admit date: 06/13/2018 Discharge date: 06/15/2018  Admitting Diagnosis: appendicitis  Discharge Diagnosis Patient Active Problem List   Diagnosis Date Noted  . Acute appendicitis 06/13/2018  . Pregnancy 03/17/2015  . Post-operative state 10/27/2012    Consultants none  Imaging: No results found.  Procedures Dr. Donell Beers (06/13/18) - Laparoscopic Appendectomy  Hospital Course:  Pt is a 37 yo female who presented to [facility] with abdominal pain.  Workup showed appendicitis.  Patient was admitted and underwent procedure listed above.  Tolerated procedure well and was transferred to the floor.  Diet was advanced as tolerated.  On POD#2, the patient was voiding well, tolerating diet, ambulating well, pain well controlled, vital signs stable, incisions c/d/i and felt stable for discharge home.  Patient will follow up as outlined below and knows to call with questions or concerns.     Patient was discharged in good condition.  The [place] Substance controlled database was reviewed prior to prescribing narcotic pain medication to this patient.  Physical Exam: General:  Alert, NAD, pleasant, cooperative Cardio: RRR, S1 & S2 normal, no murmur, rubs, gallops Resp: Effort normal, lungs CTA bilaterally, no wheezes, rales, rhonchi Abd:  Soft, ND, normal bowel sounds, mild tenderness in RLQ without guarding, incisions with glue intact are without drainage or signs of infection Skin: warm and dry, no rashes noted  Allergies as of 06/15/2018      Reactions   Tramadol    Pass Out   Sulfa Antibiotics Other (See Comments)   Childhood allergy      Medication List    TAKE these medications   amoxicillin-clavulanate 875-125 MG tablet Commonly known as:  AUGMENTIN Take 1 tablet by mouth every 12 (twelve) hours.   COLLAGEN PLUS VITAMIN C 740-125 MG  Caps Generic drug:  Collagen-Vitamin C Take 1 capsule by mouth daily.   ELDERBERRY PO Take 1 tablet by mouth daily.   fluticasone 50 MCG/ACT nasal spray Commonly known as:  FLONASE Place 2 sprays into both nostrils daily.   ibuprofen 600 MG tablet Commonly known as:  ADVIL,MOTRIN Take 1 tablet (600 mg total) by mouth every 6 (six) hours.   multivitamin with minerals Tabs tablet Take 1 tablet by mouth daily.   oxyCODONE 5 MG immediate release tablet Commonly known as:  Oxy IR/ROXICODONE Take 1 tablet (5 mg total) by mouth every 6 (six) hours as needed for moderate pain.        Follow-up Information    [facility], [place]. Call.   Specialty:  General Surgery Why:  we are working on a follow up appointment for you please call our office to see when your appointment is Contact information: [address] [place] [address] [phone]          Signed: Joyce [facility] 06/15/2018, 8:59 AM Pager: [phone] Consults: [phone] Mon-Fri 7:00 am-4:30 pm Sat-Sun 7:00 am-11:30 am

## 2018-06-15 NOTE — Progress Notes (Signed)
Patient given discharge instructions. Patient verbalized understanding. Patient  left unit in stable condition via wheelchair with nursing staff. 

## 2018-06-26 ENCOUNTER — Other Ambulatory Visit: Payer: Self-pay | Admitting: Student

## 2018-06-26 ENCOUNTER — Ambulatory Visit
Admission: RE | Admit: 2018-06-26 | Discharge: 2018-06-26 | Disposition: A | Discharge status: Home / Self Care | Payer: BLUE CROSS/BLUE SHIELD | Source: Ambulatory Visit | Attending: Student | Admitting: Student

## 2018-06-26 DIAGNOSIS — T8144XA Sepsis following a procedure, initial encounter: Secondary | ICD-10-CM

## 2018-06-26 MED ORDER — IOPAMIDOL (ISOVUE-300) INJECTION 61%
100.0000 mL | Freq: Once | INTRAVENOUS | Status: AC | PRN
  Administered 2018-06-26: 100 mL via INTRAVENOUS

## 2020-08-21 ENCOUNTER — Telehealth: Payer: Self-pay | Admitting: Infectious Diseases

## 2020-08-21 ENCOUNTER — Other Ambulatory Visit: Payer: Self-pay | Admitting: Infectious Diseases

## 2020-08-21 DIAGNOSIS — E663 Overweight: Secondary | ICD-10-CM

## 2020-08-21 DIAGNOSIS — U071 COVID-19: Secondary | ICD-10-CM

## 2020-08-21 NOTE — Progress Notes (Signed)
I connected by phone with Jenna Harrison on 08/21/2020 at 6:29 PM to discuss the potential use of a new treatment for mild to moderate COVID-19 viral infection in non-hospitalized patients.  This patient is a 39 y.o. female that meets the FDA criteria for Emergency Use Authorization of COVID monoclonal antibody casirivimab/imdevimab, bamlanivimab/etesevimab, or sotrovimab.  Has a (+) direct SARS-CoV-2 viral test result  Has mild or moderate COVID-19   Is NOT hospitalized due to COVID-19  Is within 10 days of symptom onset  Has at least one of the high risk factor(s) for progression to severe COVID-19 and/or hospitalization as defined in EUA.  Specific high risk criteria : BMI > 25 and Chronic Lung Disease   I have spoken and communicated the following to the patient or parent/caregiver regarding COVID monoclonal antibody treatment:  1. FDA has authorized the emergency use for the treatment of mild to moderate COVID-19 in adults and pediatric patients with positive results of direct SARS-CoV-2 viral testing who are 34 years of age and older weighing at least 40 kg, and who are at high risk for progressing to severe COVID-19 and/or hospitalization.  2. The significant known and potential risks and benefits of COVID monoclonal antibody, and the extent to which such potential risks and benefits are unknown.  3. Information on available alternative treatments and the risks and benefits of those alternatives, including clinical trials.  4. Patients treated with COVID monoclonal antibody should continue to self-isolate and use infection control measures (e.g., wear mask, isolate, social distance, avoid sharing personal items, clean and disinfect high touch surfaces, and frequent handwashing) according to CDC guidelines.   5. The patient or parent/caregiver has the option to accept or refuse COVID monoclonal antibody treatment.  After reviewing this information with the patient, the patient  has agreed to receive one of the available covid 19 monoclonal antibodies and will be provided an appropriate fact sheet prior to infusion. Jenna Alberts, NP 08/21/2020 6:29 PM

## 2020-08-21 NOTE — Telephone Encounter (Signed)
Called to Discuss with patient about Covid symptoms and the use of the monoclonal antibody infusion for those with mild to moderate Covid symptoms and at a high risk of hospitalization.     Pt appears to qualify for this infusion due to co-morbid conditions and/or a member of an at-risk group in accordance with the FDA Emergency Use Authorization.    Symptom onset: 12/25 Vaccinated: Primary vaccine in Feb and March no booster  Qualified for Infusion: BMI, Asthma history on PRN inhaler with frequent PNA history.   With higher frequency PNA history and un-boosted status I would like to get her on cancellation list for 12/29.    Rexene Alberts, MSN, NP-C [facility]  [email or web address] Pager: [phone] Office: [phone] [facility] Main Line: [phone]

## 2020-08-22 ENCOUNTER — Ambulatory Visit (HOSPITAL_COMMUNITY)
Admission: RE | Admit: 2020-08-22 | Discharge: 2020-08-22 | Disposition: A | Discharge status: Home / Self Care | Payer: BC Managed Care – PPO | Source: Ambulatory Visit | Attending: Pulmonary Disease | Admitting: Pulmonary Disease

## 2020-08-22 DIAGNOSIS — U071 COVID-19: Secondary | ICD-10-CM | POA: Diagnosis present

## 2020-08-22 DIAGNOSIS — E663 Overweight: Secondary | ICD-10-CM | POA: Insufficient documentation

## 2020-08-22 MED ORDER — METHYLPREDNISOLONE SODIUM SUCC 125 MG IJ SOLR: 125.0000 mg | Freq: Once | INTRAMUSCULAR | Status: DC | PRN

## 2020-08-22 MED ORDER — EPINEPHRINE 0.3 MG/0.3ML IJ SOAJ: 0.3000 mg | Freq: Once | INTRAMUSCULAR | Status: DC | PRN

## 2020-08-22 MED ORDER — SODIUM CHLORIDE 0.9 % IV SOLN: Freq: Once | INTRAVENOUS | Status: AC

## 2020-08-22 MED ORDER — DIPHENHYDRAMINE HCL 50 MG/ML IJ SOLN: 50.0000 mg | Freq: Once | INTRAMUSCULAR | Status: DC | PRN

## 2020-08-22 MED ORDER — ALBUTEROL SULFATE HFA 108 (90 BASE) MCG/ACT IN AERS: 2.0000 | INHALATION_SPRAY | Freq: Once | RESPIRATORY_TRACT | Status: DC | PRN

## 2020-08-22 MED ORDER — FAMOTIDINE IN NACL 20-0.9 MG/50ML-% IV SOLN: 20.0000 mg | Freq: Once | INTRAVENOUS | Status: DC | PRN

## 2020-08-22 MED ORDER — SODIUM CHLORIDE 0.9 % IV SOLN: INTRAVENOUS | Status: DC | PRN

## 2020-08-22 NOTE — Progress Notes (Signed)
Patient reviewed Fact Sheet for Patients, Parents, and Caregivers for Emergency Use Authorization (EUA) of Casirivimab and Imdevimab for the Treatment of Coronavirus. Patient also reviewed and is agreeable to the estimated cost of treatment. Patient is agreeable to proceed.   

## 2020-08-22 NOTE — Discharge Instructions (Signed)
10 Things You Can Do to Manage Your COVID-19 Symptoms at Home If you have possible or confirmed COVID-19: 1. Stay home from work and school. And stay away from other public places. If you must go out, avoid using any kind of public transportation, ridesharing, or taxis. 2. Monitor your symptoms carefully. If your symptoms get worse, call your healthcare provider immediately. 3. Get rest and stay hydrated. 4. If you have a medical appointment, call the healthcare provider ahead of time and tell them that you have or may have COVID-19. 5. For medical emergencies, call 911 and notify the dispatch personnel that you have or may have COVID-19. 6. Cover your cough and sneezes with a tissue or use the inside of your elbow. 7. Wash your hands often with soap and water for at least 20 seconds or clean your hands with an alcohol-based hand sanitizer that contains at least 60% alcohol. 8. As much as possible, stay in a specific room and away from other people in your home. Also, you should use a separate bathroom, if available. If you need to be around other people in or outside of the home, wear a mask. 9. Avoid sharing personal items with other people in your household, like dishes, towels, and bedding. 10. Clean all surfaces that are touched often, like counters, tabletops, and doorknobs. Use household cleaning sprays or wipes according to the label instructions. cdc.gov/coronavirus 02/24/2019 This information is not intended to replace advice given to you by your health care provider. Make sure you discuss any questions you have with your health care provider. Document Revised: 07/29/2019 Document Reviewed: 07/29/2019 Elsevier Patient Education  2020 Elsevier Inc. What types of side effects do monoclonal antibody drugs cause?  Common side effects  In general, the more common side effects caused by monoclonal antibody drugs include: . Allergic reactions, such as hives or itching . Flu-like signs and  symptoms, including chills, fatigue, fever, and muscle aches and pains . Nausea, vomiting . Diarrhea . Skin rashes . Low blood pressure   The CDC is recommending patients who receive monoclonal antibody treatments wait at least 90 days before being vaccinated.  Currently, there are no data on the safety and efficacy of mRNA COVID-19 vaccines in persons who received monoclonal antibodies or convalescent plasma as part of COVID-19 treatment. Based on the estimated half-life of such therapies as well as evidence suggesting that reinfection is uncommon in the 90 days after initial infection, vaccination should be deferred for at least 90 days, as a precautionary measure until additional information becomes available, to avoid interference of the antibody treatment with vaccine-induced immune responses. If you have any questions or concerns after the infusion please call the Advanced Practice Provider on call at [phone]. This number is ONLY intended for your use regarding questions or concerns about the infusion post-treatment side-effects.  Please do not provide this number to others for use. For return to work notes please contact your primary care provider.   If someone you know is interested in receiving treatment please have them call the COVID hotline at [phone].   

## 2020-08-22 NOTE — Progress Notes (Signed)
  Diagnosis: COVID-19  Physician: Dr. Wright  Procedure: Covid Infusion Clinic Med: casirivimab\imdevimab infusion - Provided patient with casirivimab\imdevimab fact sheet for patients, parents and caregivers prior to infusion.  Complications: No immediate complications noted.  Discharge: Discharged home   Secundino Ellithorpe M Staley Budzinski 08/22/2020  

## 2021-10-22 ENCOUNTER — Other Ambulatory Visit: Payer: Self-pay

## 2021-10-22 ENCOUNTER — Ambulatory Visit (INDEPENDENT_AMBULATORY_CARE_PROVIDER_SITE_OTHER): Payer: BC Managed Care – PPO

## 2021-10-22 ENCOUNTER — Ambulatory Visit (INDEPENDENT_AMBULATORY_CARE_PROVIDER_SITE_OTHER): Payer: BC Managed Care – PPO | Admitting: Podiatry

## 2021-10-22 DIAGNOSIS — M722 Plantar fascial fibromatosis: Secondary | ICD-10-CM

## 2021-10-22 DIAGNOSIS — M778 Other enthesopathies, not elsewhere classified: Secondary | ICD-10-CM

## 2021-10-22 DIAGNOSIS — M79672 Pain in left foot: Secondary | ICD-10-CM | POA: Diagnosis not present

## 2021-10-22 DIAGNOSIS — B351 Tinea unguium: Secondary | ICD-10-CM

## 2021-10-22 NOTE — Patient Instructions (Signed)

## 2021-10-22 NOTE — Progress Notes (Signed)
Patient presents for the 1st EPAT treatment today with complaint of left heel pain. Diagnosed with plantar fasciitis by Dr. Ardelle Anton. This has been ongoing for several months. The patient has tried ice, stretching, NSAIDS and supportive shoe gear with no long term relief.   Most of the pain is located center of heel .  ESWT administered and tolerated well.Treatment settings initiated at:   Energy: 15  Ended treatment session today with 3000 shocks at the following settings:   Energy: 15  Frequency: 6.0  Joules: 14.72   Reviewed post EPAT instructions. Advised to avoid ice and NSAIDs throughout the treatment process and to utilize boot or supportive shoes for at least the next 3 days.  Follow up for 2nd treatment in 1 week.

## 2021-10-28 NOTE — Progress Notes (Signed)
Subjective:   Patient ID: Jenna Harrison, female   DOB: 41 y.o.   MRN: [id number]   HPI 41 year old female presents the office today for concerns of left heel pain which has been ongoing about 1 year.  She has tried numerous conservative options including orthotics, icing, night splint, bracing, stretching, anti-inflammatories as well as multiple injections as well as immobilization in the cam boot.  She states that she is continue to have discomfort and she wants to consider other options and she is interested in EPAT.  She has not had any numbness or tingling or any radiating pain.  No injuries.   Review of Systems  All other systems reviewed and are negative.  Past Medical History:  Diagnosis Date   Asthma    Headache    migraines   Hx of varicella    Seizures (HCC)     Past Surgical History:  Procedure Laterality Date   BONE MARROW HARVEST     with a bone cyst also removed   CYST REMOVAL LEG     Knee   DILATION AND EVACUATION     HERNIA REPAIR  2011   LAPAROSCOPIC APPENDECTOMY N/A 06/13/2018   Procedure: APPENDECTOMY LAPAROSCOPIC;  Surgeon: Almond Lint, MD;  Location: [facility] OR;  Service: General;  Laterality: N/A;   TONSILLECTOMY AND ADENOIDECTOMY  2001   VAGINAL DELIVERY  2010   WISDOM TOOTH EXTRACTION  2003     Current Outpatient Medications:    amoxicillin-clavulanate (AUGMENTIN) 875-125 MG tablet, Take 1 tablet by mouth every 12 (twelve) hours., Disp: 10 tablet, Rfl: 0   Collagen-Vitamin C (COLLAGEN PLUS VITAMIN C) 740-125 MG CAPS, Take 1 capsule by mouth daily., Disp: , Rfl:    ELDERBERRY PO, Take 1 tablet by mouth daily., Disp: , Rfl:    fluticasone (FLONASE) 50 MCG/ACT nasal spray, Place 2 sprays into both nostrils daily., Disp: , Rfl:    ibuprofen (ADVIL,MOTRIN) 600 MG tablet, Take 1 tablet (600 mg total) by mouth every 6 (six) hours. (Patient not taking: Reported on 06/13/2018), Disp: 30 tablet, Rfl: 0   Multiple Vitamin (MULTIVITAMIN WITH MINERALS) TABS tablet,  Take 1 tablet by mouth daily., Disp: , Rfl:    oxyCODONE (OXY IR/ROXICODONE) 5 MG immediate release tablet, Take 1 tablet (5 mg total) by mouth every 6 (six) hours as needed for moderate pain., Disp: 15 tablet, Rfl: 0  Allergies  Allergen Reactions   Tramadol     Pass Out   Sulfa Antibiotics Other (See Comments)    Childhood allergy         Objective:  Physical Exam  General: AAO x3, NAD  Dermatological: Skin is warm, dry and supple bilateral.  There are no open sores, no preulcerative lesions, no rash or signs of infection present.  Vascular: Dorsalis Pedis artery and Posterior Tibial artery pedal pulses are 2/4 bilateral with immedate capillary fill time. There is no pain with calf compression, swelling, warmth, erythema.   Neruologic: Grossly intact via light touch bilateral.  Negative Tinel sign.  Musculoskeletal: Tenderness palpation on plantar approximately calcaneus at the insertion of plantar fascia.  There is no pain with lateral compression of the calcaneus.  No area of pinpoint tenderness.  Flexor, extensor tendons appear to be intact.  Muscular strength 5/5 in all groups tested bilateral.  Gait: Unassisted, Nonantalgic.       Assessment:   Chronic left heel pain, Planter fasciitis     Plan:  -Treatment options discussed including all alternatives, risks, and complications -  Etiology of symptoms were discussed -X-rays were obtained and reviewed with the patient.  Cavus foot type is present and there is calcaneal spurring present.  No evidence of acute fracture. -In regards to her symptoms we discussed with conservative as well as surgical options.  Also discussed MRI.  After discussion recommend start EPAT which she had her first treatment today.  After the course of this treatment if symptoms continue we will get an MRI.  Also consider blood work for autoimmune issues given chronic heel pain.  [name]

## 2021-11-02 ENCOUNTER — Other Ambulatory Visit: Payer: Self-pay | Admitting: Podiatry

## 2021-11-02 ENCOUNTER — Telehealth: Payer: Self-pay | Admitting: Podiatry

## 2021-11-02 ENCOUNTER — Other Ambulatory Visit: Payer: BC Managed Care – PPO

## 2021-11-02 MED ORDER — MELOXICAM 15 MG PO TABS: 15.0000 mg | ORAL_TABLET | Freq: Every day | ORAL | 0 refills | Status: DC | PRN

## 2021-11-02 NOTE — Telephone Encounter (Signed)
Patient is calling to request something for pain,in a lot of pain. She had her first Epat, machine went down and next appointment is not until 11/09/21. Does she have to start over with the treatment?Please advise. ?

## 2021-11-02 NOTE — Telephone Encounter (Signed)
Patient has been notified

## 2021-11-02 NOTE — Telephone Encounter (Signed)
Patient would like nurse to call her , she wants to know if she can resume using ice and ibuprofen for the pain, since the EPAT machine is going to be down ?

## 2021-11-05 ENCOUNTER — Telehealth: Payer: Self-pay | Admitting: Podiatry

## 2021-11-05 NOTE — Telephone Encounter (Signed)
Pt called to schedule her epat and we have her booked for 3.31 but the next avail was 4.17 and she was upset that we cannot not come in weekly like you recommended.  ? ?I explained we are closed for 4.7 and the 4.14 is already full. I did put her on waitlist if any cancelations on 4.14.  ?She is asking what do your opinion about her waiting that long between treatments. ?

## 2021-11-09 ENCOUNTER — Other Ambulatory Visit: Payer: BC Managed Care – PPO

## 2021-11-12 ENCOUNTER — Other Ambulatory Visit: Payer: Self-pay | Admitting: Podiatry

## 2021-11-12 ENCOUNTER — Telehealth: Payer: Self-pay | Admitting: *Deleted

## 2021-11-12 DIAGNOSIS — M1990 Unspecified osteoarthritis, unspecified site: Secondary | ICD-10-CM

## 2021-11-12 DIAGNOSIS — M722 Plantar fascial fibromatosis: Secondary | ICD-10-CM

## 2021-11-12 NOTE — Telephone Encounter (Signed)
Patient is calling to request another plan to make her foot feel better, concerned about paying $150 out of pocket for something she cannot meet the recommended treatment plan available. (Machine being broken, having to push back her appointments and the continued pain). ?

## 2021-11-14 NOTE — Telephone Encounter (Signed)
Please schedule

## 2021-11-23 ENCOUNTER — Other Ambulatory Visit: Payer: BC Managed Care – PPO

## 2021-11-28 LAB — HLA-B27 ANTIGEN: HLA B27: NEGATIVE

## 2021-11-28 LAB — C-REACTIVE PROTEIN: CRP: <1 mg/L (ref 0–10)

## 2021-11-28 LAB — RHEUMATOID FACTOR: Rheumatoid fact SerPl-aCnc: <10 IU/mL (ref <14.0)

## 2021-11-28 LAB — SEDIMENTATION RATE: Sed Rate: 2 mm/hr (ref 0–32)

## 2021-11-28 LAB — ANA: Anti Nuclear Antibody (ANA): NEGATIVE

## 2021-11-29 ENCOUNTER — Other Ambulatory Visit: Payer: Self-pay | Admitting: Podiatry

## 2021-12-06 ENCOUNTER — Ambulatory Visit
Admission: RE | Admit: 2021-12-06 | Discharge: 2021-12-06 | Disposition: A | Discharge status: Home / Self Care | Payer: BC Managed Care – PPO | Source: Ambulatory Visit | Attending: Podiatry | Admitting: Podiatry

## 2021-12-06 DIAGNOSIS — M722 Plantar fascial fibromatosis: Secondary | ICD-10-CM

## 2021-12-07 ENCOUNTER — Telehealth: Payer: Self-pay | Admitting: Podiatry

## 2021-12-07 NOTE — Telephone Encounter (Signed)
Patient was returning your call, if you could call her at your convenience she is going to try to get into her Mychart

## 2021-12-07 NOTE — Telephone Encounter (Signed)
Patient wants to know if she should continue with EPAT treatment?  She would like to also go MRI results. Please call

## 2021-12-10 ENCOUNTER — Ambulatory Visit (INDEPENDENT_AMBULATORY_CARE_PROVIDER_SITE_OTHER): Payer: Self-pay | Admitting: *Deleted

## 2021-12-10 DIAGNOSIS — M722 Plantar fascial fibromatosis: Secondary | ICD-10-CM

## 2021-12-10 NOTE — Patient Instructions (Signed)

## 2021-12-10 NOTE — Progress Notes (Signed)
Patient presents for the 1st (started over after mechanical failure) EPAT treatment today with complaint of plantar heel pain left. Diagnosed with plantar fasciitis by Dr. Ardelle Anton. This has been ongoing for several months. The patient has tried ice, stretching, NSAIDS and supportive shoe gear with no long term relief.  ? ?Most of the pain is located central plantar heel and medial and lateral foot. ? ?ESWT administered and tolerated well.Treatment settings initiated at: ? ? Energy: 12 ? ?~Patient was extremely sensitive today~ ? ?Ended treatment session today with 3000 shocks at the following settings: ? ? Energy: 12 ? Frequency: 6.0 ? Joules: 11.77 ? ? ?Reviewed post EPAT instructions. Advised to avoid ice and NSAIDs throughout the treatment process and to utilize boot or supportive shoes for at least the next 3 days. ? ?Follow up for 2nd treatment in 1 week.  ? ?~Dr. Ardelle Anton would like patient to have 3 consecutive treatments and then follow up with him~ ?

## 2021-12-11 ENCOUNTER — Telehealth: Payer: Self-pay | Admitting: Podiatry

## 2021-12-11 NOTE — Telephone Encounter (Signed)
Pt called and was told that someone would be calling her to schedule her next week and the week after for epat. 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 ?I explained we are working on the nurse schedule and I know her availability of early am 730 or after 300 due to her work schedule. I asked if it would be ok that I call her back later this afternoon. ?

## 2021-12-18 ENCOUNTER — Ambulatory Visit (INDEPENDENT_AMBULATORY_CARE_PROVIDER_SITE_OTHER): Payer: Self-pay

## 2021-12-18 DIAGNOSIS — B351 Tinea unguium: Secondary | ICD-10-CM

## 2021-12-18 DIAGNOSIS — M722 Plantar fascial fibromatosis: Secondary | ICD-10-CM

## 2021-12-18 NOTE — Progress Notes (Signed)
Patient presents for the 2nd (started over after mechanical failure) EPAT treatment today with complaint of plantar heel pain left. Diagnosed with plantar fasciitis by Dr. Ardelle Anton. This has been ongoing for several months. The patient has tried ice, stretching, NSAIDS and supportive shoe gear with no long term relief.  ? ?Most of the pain is located central plantar heel and medial and lateral foot. ? ?ESWT administered and tolerated well.Treatment settings initiated at: ? ? Energy: 15 ? ? ? ?Ended treatment session today with 3000 shocks at the following settings: ? ? Energy: 15 ? Frequency: 6.0 ? Joules: 14.72 ? ? ?Reviewed post EPAT instructions. Advised to avoid ice and NSAIDs throughout the treatment process and to utilize boot or supportive shoes for at least the next 3 days. ? ?Follow up for 3rd treatment in 1 week.  ? ?~Dr. Ardelle Anton would like patient to have 3 consecutive treatments and then follow up with him~ ?

## 2021-12-24 ENCOUNTER — Ambulatory Visit (INDEPENDENT_AMBULATORY_CARE_PROVIDER_SITE_OTHER): Payer: BC Managed Care – PPO

## 2021-12-24 DIAGNOSIS — B351 Tinea unguium: Secondary | ICD-10-CM

## 2021-12-24 DIAGNOSIS — M722 Plantar fascial fibromatosis: Secondary | ICD-10-CM

## 2021-12-24 NOTE — Progress Notes (Signed)
Patient presents for the 3rd (started over after mechanical failure) EPAT treatment today with complaint of plantar heel pain left. Diagnosed with plantar fasciitis by Dr. Ardelle Anton. This has been ongoing for several months. The patient has tried ice, stretching, NSAIDS and supportive shoe gear with no long term relief.  ? ?Most of the pain is located central plantar heel and medial and lateral foot. ? ?ESWT administered and tolerated well.Treatment settings initiated at: ? ? Energy: 20 ? ? ? ?Ended treatment session today with 3000 shocks at the following settings: ? ? Energy: 20 ? Frequency: 5.0 ? Joules: 19.62 ? ? ?Reviewed post EPAT instructions. Advised to avoid ice and NSAIDs throughout the treatment process and to utilize boot or supportive shoes for at least the next 3 days. ? ? ?~Dr. Ardelle Anton would like patient to have 3 consecutive treatments and then follow up with him~ ?

## 2021-12-27 ENCOUNTER — Other Ambulatory Visit: Payer: Self-pay | Admitting: Podiatry

## 2021-12-27 ENCOUNTER — Telehealth: Payer: Self-pay | Admitting: Podiatry

## 2021-12-27 NOTE — Telephone Encounter (Signed)
Patient called and started that she did not submit a refill request through her pharmacy for Meloxicam. Please disregard  ?

## 2021-12-31 ENCOUNTER — Telehealth: Payer: Self-pay | Admitting: *Deleted

## 2021-12-31 NOTE — Telephone Encounter (Signed)
Patient is calling because her foot started to hurt really bad one day ago, purchased a PF OTC sleeve which helped some. Should she start icing and using the mobic. May leave a detailed message on phone. Please advise. ?

## 2022-01-10 ENCOUNTER — Telehealth: Payer: Self-pay | Admitting: *Deleted

## 2022-01-10 NOTE — Telephone Encounter (Signed)
Patient is calling for confirmation that she can start taking meloxicam,has been 2 weeks since E pat and still in a lot of pain.Please advise.

## 2022-01-15 NOTE — Telephone Encounter (Signed)
Patient has been notified, verbalized understanding.

## 2022-01-28 ENCOUNTER — Ambulatory Visit: Payer: BC Managed Care – PPO | Admitting: Podiatry

## 2022-01-29 ENCOUNTER — Telehealth: Payer: Self-pay | Admitting: Podiatry

## 2022-01-29 NOTE — Telephone Encounter (Signed)
Pt called and is upset that she was scheduled yesterday for epat follow up and to discuss surgery. 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 I do not have anywhere to work her in on your schedule. Please advise

## 2022-01-30 NOTE — Telephone Encounter (Signed)
Called pt to let her know I am still waiting for an appt as Dr Ardelle Anton is currently full but I will call her asap to get her in.

## 2022-02-07 ENCOUNTER — Ambulatory Visit (INDEPENDENT_AMBULATORY_CARE_PROVIDER_SITE_OTHER): Payer: BC Managed Care – PPO | Admitting: Podiatry

## 2022-02-07 DIAGNOSIS — M216X2 Other acquired deformities of left foot: Secondary | ICD-10-CM

## 2022-02-07 DIAGNOSIS — M722 Plantar fascial fibromatosis: Secondary | ICD-10-CM

## 2022-02-07 DIAGNOSIS — M7672 Peroneal tendinitis, left leg: Secondary | ICD-10-CM | POA: Diagnosis not present

## 2022-02-07 DIAGNOSIS — M779 Enthesopathy, unspecified: Secondary | ICD-10-CM

## 2022-02-07 NOTE — Patient Instructions (Signed)

## 2022-02-10 NOTE — Progress Notes (Signed)
Subjective: 41 year old female presents the office today for ongoing pain to her left heel as well as along the peroneal tendon.  She states the mornings are the worst but she gets pain throughout the day as well.  Prior to her coming to see me she had to continue with conservative treatments including orthotics, icing, Bracing, stretching, anti-inflammatories as well as injections.  We also did blood work to rule out any underlying component of autoimmune issues which that work-up was negative.  She continues to have symptoms and presented to the office.  We underwent EPAT as well as physical therapy.  MRI was performed.  This time she is continue to have discomfort she wants to discuss surgical intervention.  Objective: AAO x3, NAD DP/PT pulses palpable bilaterally, CRT less than 3 seconds Majority discomfort is localized on the plantar aspect of calcaneus along insertion of plantar fascia but also in the course of the peroneal tendon today.  There is no area pinpoint tenderness.  Equinus is present on the left foot.  MMT 5/5.  Negative Tinel sign. No pain with calf compression, swelling, warmth, erythema  Assessment: Plantar fasciitis, peroneal tendinitis, equinus  Plan: -All treatment options discussed with the patient including all alternatives, risks, complications.  -I reviewed the MRI with her.  Discussed continued conservative care versus surgical intervention.  Ultimately decided to proceed with surgical intervention.  Discussed with her gastrocnemius recession, EPF, peroneal tendon repair with PRP injection as well.  After discussion she wishes to proceed with this. -The incision placement as well as the postoperative course was discussed with the patient. I discussed risks of the surgery which include, but not limited to, infection, bleeding, pain, swelling, need for further surgery, delayed or nonhealing, painful or ugly scar, numbness or sensation changes, over/under correction,  recurrence, transfer lesions, further deformity, hardware failure, DVT/PE, loss of toe/foot. Patient understands these risks and wishes to proceed with surgery. The surgical consent was reviewed with the patient all 3 pages were signed. No promises or guarantees were given to the outcome of the procedure. All questions were answered to the best of my ability. Before the surgery the patient was encouraged to call the office if there is any further questions. The surgery will be performed at the [facility] on an outpatient basis. -Patient encouraged to call the office with any questions, concerns, change in symptoms.   [name]

## 2022-02-19 ENCOUNTER — Telehealth: Payer: Self-pay | Admitting: Urology

## 2022-02-19 NOTE — Telephone Encounter (Signed)
DOS - 03/20/22  EPF LEFT --- 69629 GASTROCNEMIUS RECESS LEFT --- 52841 REPAIR TENDON LEFT --- 32440   [employer] EFFECTIVE DATE 04/26/17  PLAN DEDUCTIBLE -  $5,000.00 W/ $3,511.00 REMAINING OUT OF POCKET - $8,000.00 W/ $6,511.00 REMAINING COINSURANCE - 20% COPAY - $0.00   SPOKE WITH Jenna P. WITH [employer] AND HE STATED THAT FOR CPT CODES 10272, 53664 AND [phone] NO PRIOR AUTH IS REQUIRED.   REF # I - [id number]

## 2022-02-27 ENCOUNTER — Telehealth: Payer: Self-pay | Admitting: Podiatry

## 2022-02-27 NOTE — Telephone Encounter (Signed)
"  I'm a patient of Dr. Ardelle Anton.  I'm scheduled for surgery on 03/20/2022.  I need to know when I will be able to return to work after surgery.  I just spoke to the surgery coordinator.  She said to give you a call.  I need to know my return to work for my employer.

## 2022-02-28 NOTE — Telephone Encounter (Signed)
Pt would like to speak with Dr Ardelle Anton about his recommendations as far as her return to work and the recovery time. She feels that if she speaks with Dr Ardelle Anton, it will help calm her anxiety.  Please advise.

## 2022-03-01 ENCOUNTER — Other Ambulatory Visit: Payer: Self-pay | Admitting: Podiatry

## 2022-03-01 DIAGNOSIS — M722 Plantar fascial fibromatosis: Secondary | ICD-10-CM

## 2022-03-04 ENCOUNTER — Telehealth: Payer: Self-pay | Admitting: Podiatry

## 2022-03-04 ENCOUNTER — Telehealth: Payer: Self-pay | Admitting: *Deleted

## 2022-03-04 NOTE — Telephone Encounter (Signed)
"  I wanted to see if I could speak to him briefly about my surgery, the recovery time.  I tried to call him last week but I know he was on vacation.  I just want to do a check-in."

## 2022-03-20 ENCOUNTER — Encounter: Payer: Self-pay | Admitting: Podiatry

## 2022-03-20 ENCOUNTER — Other Ambulatory Visit: Payer: Self-pay | Admitting: Podiatry

## 2022-03-20 DIAGNOSIS — M216X2 Other acquired deformities of left foot: Secondary | ICD-10-CM | POA: Diagnosis not present

## 2022-03-20 DIAGNOSIS — M722 Plantar fascial fibromatosis: Secondary | ICD-10-CM | POA: Diagnosis not present

## 2022-03-20 DIAGNOSIS — M7672 Peroneal tendinitis, left leg: Secondary | ICD-10-CM | POA: Diagnosis not present

## 2022-03-20 MED ORDER — OXYCODONE-ACETAMINOPHEN 5-325 MG PO TABS: 1.0000 | ORAL_TABLET | Freq: Four times a day (QID) | ORAL | 0 refills | Status: AC | PRN

## 2022-03-20 MED ORDER — PROMETHAZINE HCL 25 MG PO TABS: 25.0000 mg | ORAL_TABLET | Freq: Three times a day (TID) | ORAL | 0 refills | Status: AC | PRN

## 2022-03-20 MED ORDER — CEPHALEXIN 500 MG PO CAPS: 500.0000 mg | ORAL_CAPSULE | Freq: Three times a day (TID) | ORAL | 0 refills | Status: AC

## 2022-03-20 NOTE — Progress Notes (Signed)
Postop medications sent 

## 2022-03-25 ENCOUNTER — Ambulatory Visit (INDEPENDENT_AMBULATORY_CARE_PROVIDER_SITE_OTHER): Payer: BC Managed Care – PPO

## 2022-03-25 ENCOUNTER — Ambulatory Visit (INDEPENDENT_AMBULATORY_CARE_PROVIDER_SITE_OTHER): Payer: BC Managed Care – PPO | Admitting: Podiatry

## 2022-03-25 DIAGNOSIS — Z9889 Other specified postprocedural states: Secondary | ICD-10-CM | POA: Diagnosis not present

## 2022-03-25 DIAGNOSIS — M216X2 Other acquired deformities of left foot: Secondary | ICD-10-CM

## 2022-03-25 DIAGNOSIS — M722 Plantar fascial fibromatosis: Secondary | ICD-10-CM

## 2022-03-25 DIAGNOSIS — M7672 Peroneal tendinitis, left leg: Secondary | ICD-10-CM

## 2022-03-25 NOTE — Progress Notes (Signed)
Subjective: Jenna Harrison is a 41 y.o. is seen today in office s/p left foot endoscopic plantar fascial release, gastrocnemius recession, peroneal tendon repair, PRP injection preformed on 03/20/2022.  Said that she still having soreness.  She is having some residual numbness.  Denies any systemic complaints such as fevers, chills, nausea, vomiting. No calf pain, chest pain, shortness of breath.   Objective: General: No acute distress, AAOx3  DP/PT pulses palpable 2/4, CRT < 3 sec to all digits.  Protective sensation intact. Motor function intact.  Left foot: Incision is well coapted without any evidence of dehiscence with sutures intact mild. There is no surrounding erythema, ascending cellulitis, fluctuance, crepitus, malodor, drainage/purulence. There is mild edema around the surgical site. There is mild pain along the surgical site.  Motor function intact. No other areas of tenderness to bilateral lower extremities.  No other open lesions or pre-ulcerative lesions.  No pain with calf compression, swelling, warmth, erythema.   Assessment and Plan:  Status post left foot surgery, doing well with no complications   -Treatment options discussed including all alternatives, risks, and complications -Continue reviewed.  3 views of the foot were obtained.  Evidence of acute fracture.  Skin staples intact. -Incision appears to be healing well.  Antibiotic ointment and bandage applied.  Keep the dressing clean, dry, intact until follow-up. -Continue nonweightbearing in cam boot. -Ice/elevation -Pain medication as needed. -Monitor for any clinical signs or symptoms of infection and DVT/PE and directed to call the office immediately should any occur or go to the ER. -Follow-up as scheduled for possible suture, staple removal or sooner if any problems arise. In the meantime, encouraged to call the office with any questions, concerns, change in symptoms.   Ovid Curd, DPM

## 2022-04-04 ENCOUNTER — Encounter: Payer: Self-pay | Admitting: Podiatry

## 2022-04-04 ENCOUNTER — Ambulatory Visit (INDEPENDENT_AMBULATORY_CARE_PROVIDER_SITE_OTHER): Payer: BC Managed Care – PPO | Admitting: Podiatry

## 2022-04-04 DIAGNOSIS — Z9889 Other specified postprocedural states: Secondary | ICD-10-CM

## 2022-04-04 DIAGNOSIS — M722 Plantar fascial fibromatosis: Secondary | ICD-10-CM

## 2022-04-04 DIAGNOSIS — M7672 Peroneal tendinitis, left leg: Secondary | ICD-10-CM

## 2022-04-06 NOTE — Progress Notes (Signed)
Subjective: Jenna Harrison is a 41 y.o. is seen today in office s/p left foot endoscopic plantar fascial release, gastrocnemius recession, peroneal tendon repair, PRP injection preformed on 03/20/2022.  She presents today for possible suture removal.  Some some discomfort but overall seems to be improving.  Denies any fevers or chills.  No other concerns today.   Objective: General: No acute distress, AAOx3  DP/PT pulses palpable 2/4, CRT < 3 sec to all digits.  Protective sensation intact. Motor function intact.  Left foot: Incision is well coapted without any evidence of dehiscence with sutures are intact as well as staples.  There is no surrounding erythema, ascending cellulitis, fluctuance, crepitus, malodor, drainage/purulence. There is mild but improved edema around the surgical site. There is mild pain along the surgical site.  Motor function intact. No other areas of tenderness to bilateral lower extremities.  No other open lesions or pre-ulcerative lesions.  No pain with calf compression, swelling, warmth, erythema.   Assessment and Plan:  Status post left foot surgery, doing well with no complications   -Treatment options discussed including all alternatives, risks, and complications -Today with the sutures without the staples intact.  Antibiotic ointment and a bandage was applied.  Discussed that she can wash with soap and water dry thoroughly apply a similar bandage.  If she does not feel comfortable doing this then she can leave the bandage intact until next week when we remove the staples. -Continue nonweightbearing in cam boot. -Ice/elevation -Pain medication as needed. -Monitor for any clinical signs or symptoms of infection and DVT/PE and directed to call the office immediately should any occur or go to the ER. -Follow-up as scheduled for staple removal or sooner if any problems arise. In the meantime, encouraged to call the office with any questions, concerns, change in  symptoms.   Ovid Curd, DPM

## 2022-04-08 NOTE — Telephone Encounter (Signed)
NO MESSAGE

## 2022-04-12 ENCOUNTER — Ambulatory Visit (INDEPENDENT_AMBULATORY_CARE_PROVIDER_SITE_OTHER): Payer: BC Managed Care – PPO | Admitting: Podiatry

## 2022-04-12 ENCOUNTER — Ambulatory Visit (INDEPENDENT_AMBULATORY_CARE_PROVIDER_SITE_OTHER): Payer: BC Managed Care – PPO

## 2022-04-12 DIAGNOSIS — Z9889 Other specified postprocedural states: Secondary | ICD-10-CM | POA: Diagnosis not present

## 2022-04-12 DIAGNOSIS — M722 Plantar fascial fibromatosis: Secondary | ICD-10-CM

## 2022-04-15 NOTE — Progress Notes (Signed)
Subjective: Jenna Harrison is a 41 y.o. is seen today in office s/p left foot endoscopic plantar fascial release, gastrocnemius recession, peroneal tendon repair, PRP injection preformed on 03/20/2022.  She presents today with the staples removed.  She said pain is improving.  Denies any fevers or chills.  She is remained nonweightbearing in cam boot.  She has no other concerns today.    Objective: General: No acute distress, AAOx3  DP/PT pulses palpable 2/4, CRT < 3 sec to all digits.  Protective sensation intact. Motor function intact.  Left foot: Incision is well coapted without any evidence of dehiscence with staples intact.  There is minimal edema.  There is no erythema or warmth there is no clinical signs of infection.  No drainage or pus. No other open lesions or pre-ulcerative lesions.  No pain with calf compression, swelling, warmth, erythema.   Assessment and Plan:  Status post left foot surgery, doing well with no complications   -Treatment options discussed including all alternatives, risks, and complications -X-rays obtained reviewed.  No evidence of acute fracture noted today.  Staples intact. -I reviewed the staples today with any complications.  Incision is well coapted.  Steri-Strips applied for reinforcement.  Antibiotic ointment and bandage applied.  Discussed that she can start to wash the foot with soap and water, dry thoroughly apply a similar bandage. -For now remain nonweightbearing in the cam boot.  Discussed next week at the 4-week mark she can start some partial weightbearing.  Continue ice, elevate as well as compression to help with any residual edema. -Monitor for any clinical signs or symptoms of infection and DVT/PE and directed to call the office immediately should any occur or go to the ER. -Follow-up as scheduled for sooner if any problems arise. In the meantime, encouraged to call the office with any questions, concerns, change in symptoms.   *No x-rays needed  unless there is an injury/issue  Ovid Curd, DPM

## 2022-04-16 ENCOUNTER — Ambulatory Visit (INDEPENDENT_AMBULATORY_CARE_PROVIDER_SITE_OTHER): Payer: BC Managed Care – PPO

## 2022-04-16 ENCOUNTER — Ambulatory Visit (INDEPENDENT_AMBULATORY_CARE_PROVIDER_SITE_OTHER): Payer: BC Managed Care – PPO | Admitting: Podiatry

## 2022-04-16 DIAGNOSIS — Z9889 Other specified postprocedural states: Secondary | ICD-10-CM | POA: Diagnosis not present

## 2022-04-16 DIAGNOSIS — M79672 Pain in left foot: Secondary | ICD-10-CM | POA: Diagnosis not present

## 2022-04-16 DIAGNOSIS — M722 Plantar fascial fibromatosis: Secondary | ICD-10-CM

## 2022-04-16 MED ORDER — IBUPROFEN 800 MG PO TABS: 800.0000 mg | ORAL_TABLET | Freq: Three times a day (TID) | ORAL | 0 refills | Status: DC | PRN

## 2022-04-18 ENCOUNTER — Encounter: Payer: BC Managed Care – PPO | Admitting: Podiatry

## 2022-04-23 NOTE — Progress Notes (Signed)
Subjective: Jenna Harrison is a 41 y.o. is seen today in office s/p left foot endoscopic plantar fascial release, gastrocnemius recession, peroneal tendon repair, PRP injection preformed on 03/20/2022.  States that she has noticed some swelling to her foot.  She is also scared to start putting weight on her foot.  Denies any fevers or chills.  No injuries.  No other concerns.  Objective: General: No acute distress, AAOx3  DP/PT pulses palpable 2/4, CRT < 3 sec to all digits.  Protective sensation intact. Motor function intact.  Left foot: Incision is well coapted without any evidence of dehiscence and scars are forming.  There is some scabbing present on the incision.  There is some edema present to the foot there is no erythema or warmth.  Mild tenderness palpation at surgical sites.  No erythema or any signs of infection noted today.   There is minimal edema.  There is no erythema or warmth there is no clinical signs of infection.  No drainage or pus. No pain with calf compression, swelling, warmth, erythema.   Assessment and Plan:  Status post left foot surgery, doing well with no complications   -Treatment options discussed including all alternatives, risks, and complications -Incisions appear to be healing well.  Discussed washing with soap and water, dry thoroughly.  Discussed continue nonweightbearing for now and discussed as she starts to feel better over the next week or so she can start to transition to partial weightbearing although limited.  Continue ice, elevate as well as compression likely postoperative edema.  Return in about 2 weeks (around 04/30/2022).  [name]  *No x-rays needed unless there is an injury/issue

## 2022-05-03 ENCOUNTER — Ambulatory Visit (INDEPENDENT_AMBULATORY_CARE_PROVIDER_SITE_OTHER): Payer: BC Managed Care – PPO | Admitting: Podiatry

## 2022-05-03 DIAGNOSIS — M722 Plantar fascial fibromatosis: Secondary | ICD-10-CM

## 2022-05-03 DIAGNOSIS — M7672 Peroneal tendinitis, left leg: Secondary | ICD-10-CM

## 2022-05-03 MED ORDER — IBUPROFEN 800 MG PO TABS: 800.0000 mg | ORAL_TABLET | Freq: Three times a day (TID) | ORAL | 0 refills | Status: AC | PRN

## 2022-05-03 NOTE — Progress Notes (Unsigned)
Subjective: Chief Complaint  Patient presents with   Routine Post Op    Patient came in today for a left foot surgery follow-up, patient is doing well, patient denies any pain but does have soreness, TX: Cam Boot, pain meds     Jenna Harrison is a 41 y.o. is seen today in office s/p left foot endoscopic plantar fascial release, gastrocnemius recession, peroneal tendon repair, PRP injection preformed on 03/20/2022.  She states that she is having soreness more on the incision on the lateral aspect of foot.  Some occasional soreness in the bottom of the heel.  She been still walking with a cam boot using a crutch.  The top of her foot.  No recent injury or falls.  No fevers or chills.  No other concerns.   Objective: General: No acute distress, AAOx3  DP/PT pulses palpable 2/4, CRT < 3 sec to all digits.  Protective sensation intact. Motor function intact.  Left foot: Incision is well coapted without any evidence of dehiscence and scars are forming.  Discomfort the plantar aspect the heel and this is still having some swelling present.  There is no erythema or warmth.  No significant pain on the course the peroneal tendon or the gastrocnemius recession site. No clinical signs of infection. No pain with calf compression, swelling, warmth, erythema.   Assessment and Plan:  Status post left foot surgery, doing well with no complications   -Treatment options discussed including all alternatives, risks, and complications -At this time we are going to start physical therapy.  New referral for [facility] was placed. -The nuclear assistance of the abdomen was too big. -Night splint to sleep in to help continue to stretch the plantar fascia. -Discussed as she starts physical therapy gradual transition to regular shoe as tolerated. -As result of compression, the patient.  Should resolve over time.  We will monitor  *No x-rays needed unless there is an injury/issue  Jenna Harrison  DPM

## 2022-06-03 ENCOUNTER — Ambulatory Visit (INDEPENDENT_AMBULATORY_CARE_PROVIDER_SITE_OTHER): Payer: BC Managed Care – PPO | Admitting: Podiatry

## 2022-06-03 DIAGNOSIS — Z9889 Other specified postprocedural states: Secondary | ICD-10-CM

## 2022-06-03 DIAGNOSIS — M722 Plantar fascial fibromatosis: Secondary | ICD-10-CM

## 2022-06-03 DIAGNOSIS — M7672 Peroneal tendinitis, left leg: Secondary | ICD-10-CM

## 2022-06-03 NOTE — Progress Notes (Unsigned)
Subjective: Chief Complaint  Patient presents with   Routine Post Op    Patient came in today for a left foot surgery follow-up, patient is doing well, patient denies any pain but does have soreness, TX: Cam Boot, pain meds     Jenna Harrison is a 41 y.o. is seen today in office s/p left foot endoscopic plantar fascial release, gastrocnemius recession, peroneal tendon repair, PRP injection preformed on 03/20/2022.  She states that she is having soreness more on the incision on the lateral aspect of foot.  Some occasional soreness in the bottom of the heel.  She been still walking with a cam boot using a crutch.  The top of her foot.  No recent injury or falls.  No fevers or chills.  No other concerns.   Objective: General: No acute distress, AAOx3  DP/PT pulses palpable 2/4, CRT < 3 sec to all digits.  Protective sensation intact. Motor function intact.  Left foot: Incision is well coapted without any evidence of dehiscence and scars are forming.  Discomfort the plantar aspect the heel and this is still having some swelling present.  There is no erythema or warmth.  No significant pain on the course the peroneal tendon or the gastrocnemius recession site. No clinical signs of infection. No pain with calf compression, swelling, warmth, erythema.   Assessment and Plan:  Status post left foot surgery, doing well with no complications   -Treatment options discussed including all alternatives, risks, and complications -At this time we are going to start physical therapy.  New referral for [facility] was placed. -The nuclear assistance of the abdomen was too big. -Night splint to sleep in to help continue to stretch the plantar fascia. -Discussed as she starts physical therapy gradual transition to regular shoe as tolerated. -As result of compression, the patient.  Should resolve over time.  We will monitor  *No x-rays needed unless there is an injury/issue  Trula Slade  DPM

## 2022-08-02 ENCOUNTER — Ambulatory Visit (INDEPENDENT_AMBULATORY_CARE_PROVIDER_SITE_OTHER): Payer: BC Managed Care – PPO | Admitting: Podiatry

## 2022-08-02 DIAGNOSIS — M7672 Peroneal tendinitis, left leg: Secondary | ICD-10-CM | POA: Diagnosis not present

## 2022-08-02 DIAGNOSIS — M722 Plantar fascial fibromatosis: Secondary | ICD-10-CM

## 2022-08-04 NOTE — Progress Notes (Signed)
Subjective: Chief Complaint  Patient presents with   Plantar Fasciitis    Plantar Fasciitis follow-up, patient wore heels last night and is having some pain, the patient states that the ankle oand back of the leg is sore, TX: P.T.     Jenna Harrison is a 41 y.o. is seen today in office s/p left foot endoscopic plantar fascial release, gastrocnemius recession, peroneal tendon repair, PRP injection preformed on 03/20/2022.  Overall states that she has been doing well.  She is actually run a mile where she would go and walk combination.  She did have to wear heels last night so she is having some soreness today but otherwise has been doing well.  She is continue with physical therapy.  She is wearing a regular shoe full-time.  Objective: General: No acute distress, AAOx3  DP/PT pulses palpable 2/4, CRT < 3 sec to all digits.  Protective sensation intact. Motor function intact.  Left foot: Incision is well coapted without any evidence of dehiscence and scars have formed.  Mild generalized discomfort on the plantar fascia, peroneal tendon but no significant pinpoint tenderness.  Minimal edema but there is no erythema or warmth.  Clinically the tendons appear to be intact.  MMT 5/5. No pain with calf compression, swelling, warmth, erythema.   Assessment and Plan:  Status post left foot surgery, doing well with no complications   -Treatment options discussed including all alternatives, risks, and complications -Overall she still making good progress and she continues to improve.  Discussed the wounds at home stretching, rehab regularly particularly before and after activity.  Continue with a night splint.  Continue supportive shoe gear. -At this point she seems to be doing well.  Continue with the rehab and home exercises.  No follow-up with her as needed but encouraged her to call me with any changes or questions.  [name]

## 2022-08-30 IMAGING — MR MR ANKLE*L* W/O CM
4 of 5 series · 18 of 40 positions shown · non-contrast
Comparison: Left foot radiographs 10/22/2021

CLINICAL DATA: Ankle pain. Tendon abnormality suspected. History of
plantar fasciitis. Pain for 2 years.

EXAM:
MRI OF THE LEFT ANKLE WITHOUT CONTRAST
TECHNIQUE: Multiplanar, multisequence MR imaging of the ankle was performed. No
intravenous contrast was administered.

[Series 3: PD fat-sat · axial · left · 3.0mm · 0.31mm/px · z∈[-67,+75]mm · 8 of 37 slices shown]
[im 1/37]
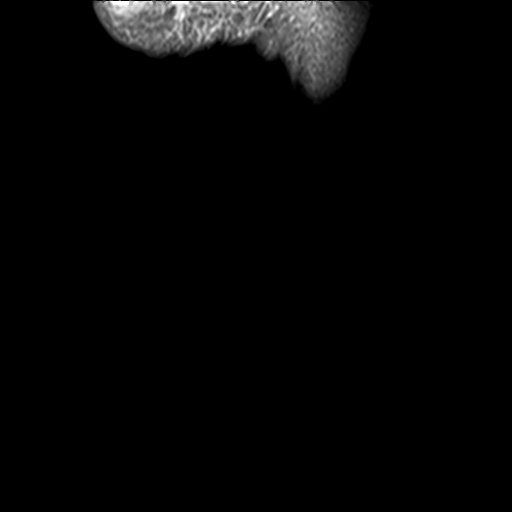
[im 5/37]
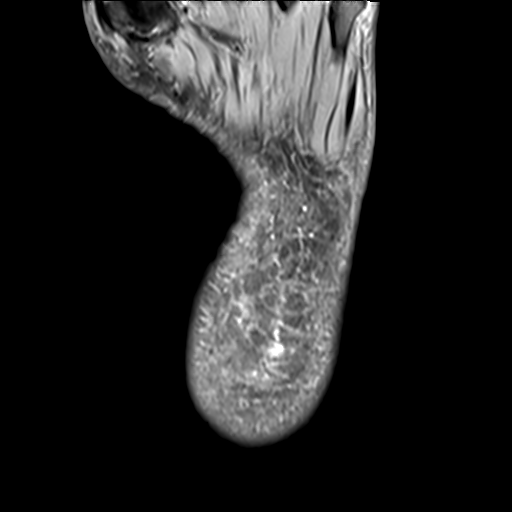
[im 13/37]
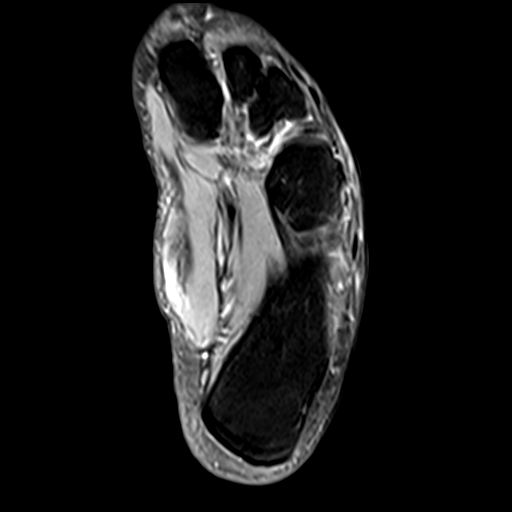
[im 17/37]
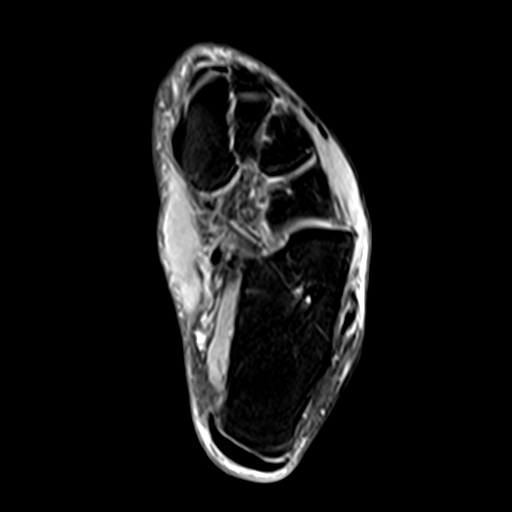
[im 21/37]
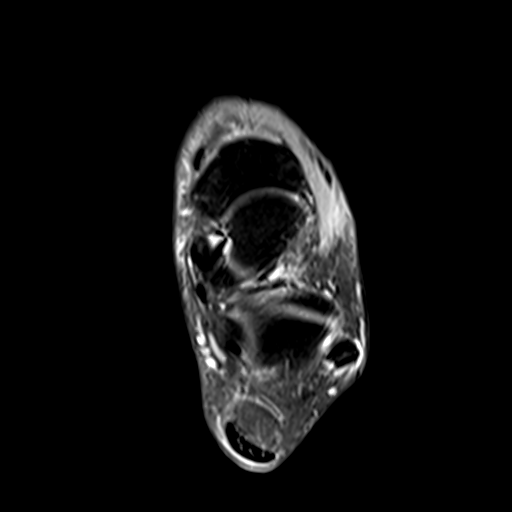
[im 25/37]
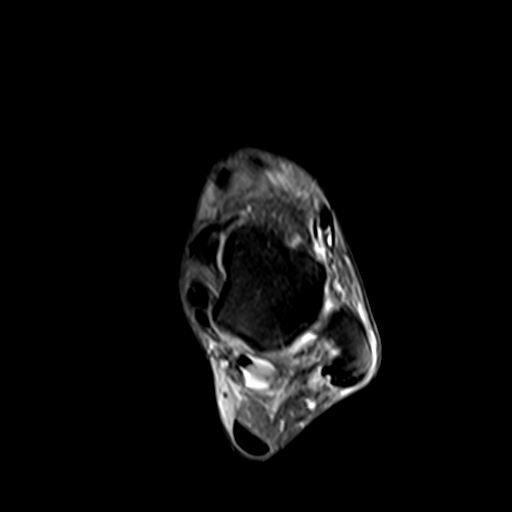
[im 33/37]
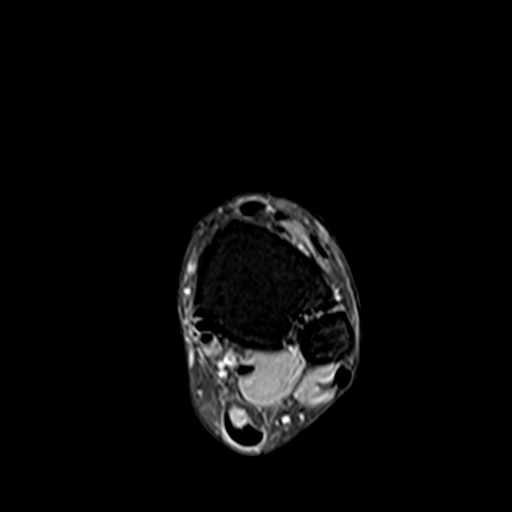
[im 37/37]
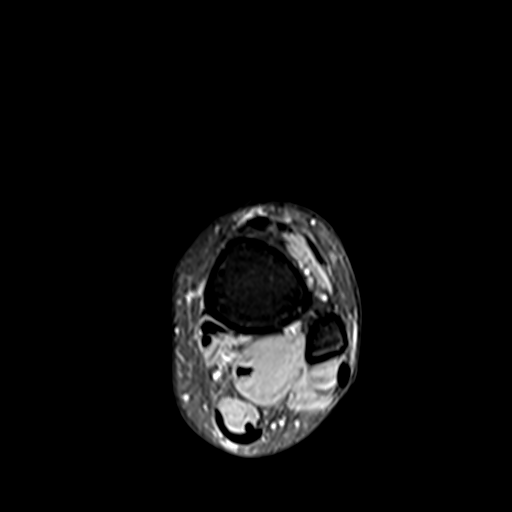

[Series 4: T2 fat-sat · axial · left · 3.0mm · 0.31mm/px · z∈[-67,+55]mm · 4 of 37 slices shown (1 of 2)]
[im 1/37]
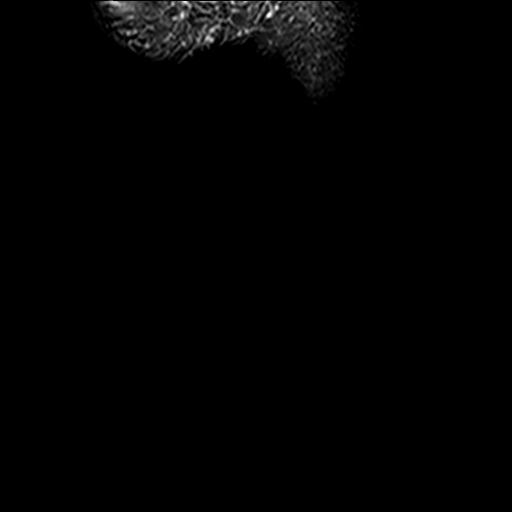
[im 5/37]
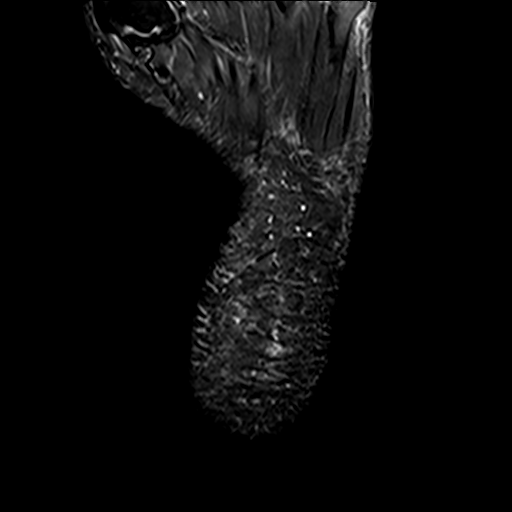
[im 19/37]
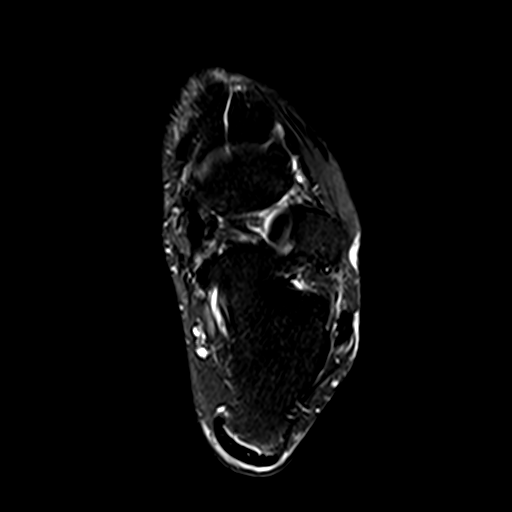
[im 32/37]
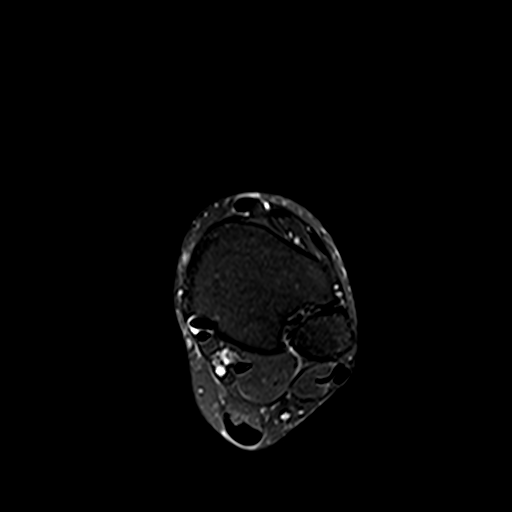

[Series 5: T1 · sagittal · left · 4.0mm · 0.33mm/px · 3 of 26 slices shown]
[im 6/26]
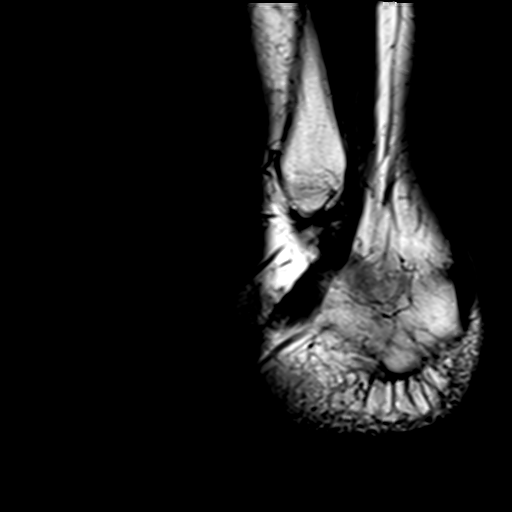
[im 16/26]
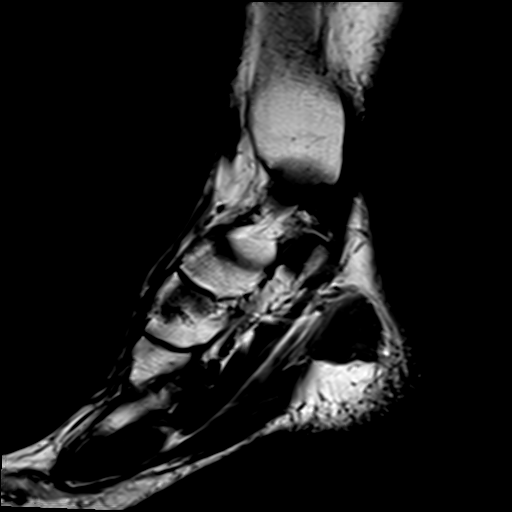
[im 26/26]
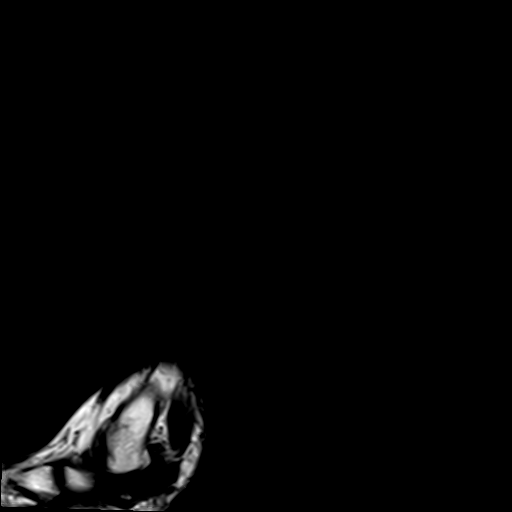

[Series 7: T2 fat-sat · oblique · left · 3.0mm · 0.31mm/px · 3 of 39 slices shown (2 of 2)]
[im 5/39]
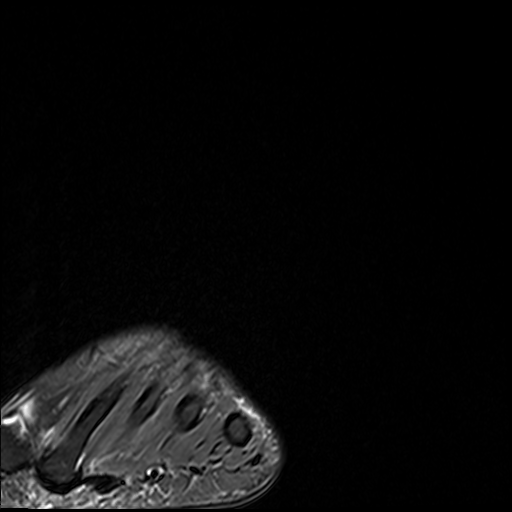
[im 20/39]
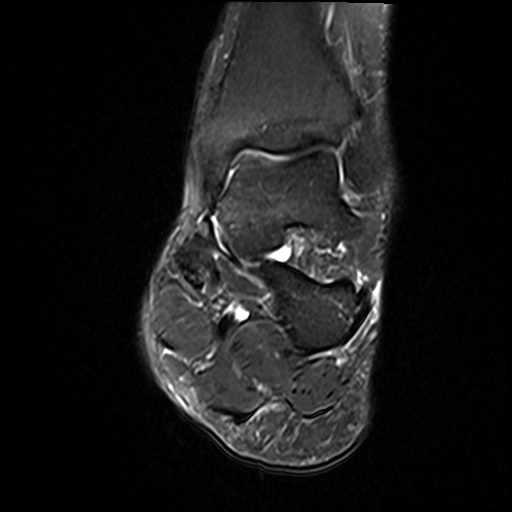
[im 34/39]
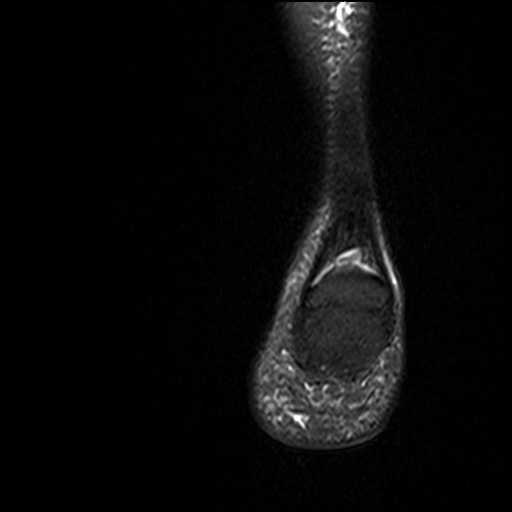

[18 of 40 positions shown; findings below may reference images not displayed]

FINDINGS: TENDONS

Peroneal: Minimal peroneus longus and brevis tenosynovitis. Mild
"Pb configuration" of the peroneus brevis tendon starting just
distal to the fibula (axial images 18 through 21, coronal images 18
and 19, a partial-thickness longitudinal split tear. The peroneus
longus tendon is intact.

Posteromedial: Mild posterior tibial tenosynovitis. The flexor
digitorum longus tendon is intact. Mild flexor hallucis longus
tenosynovitis.

Anterior: The tibialis anterior and extensor hallucis longus tendons
are intact. Mild extensor digitorum longus tenosynovitis at the
level of the anterior tibiotalar joint.

Achilles: The Achilles tendon is normal in caliber and signal. There
is minimal fluid around the Achilles tendon/paratenon. Trace fluid
within the pre Achilles bursa.

Plantar Fascia: Mild-to-moderate plantar calcaneal heel spur. Mild
fluid superficial and deep to the origin of the medial greater than
lateral bands of the plantar fascia (sagittal STIR series 6 images
16 through 20), likely a partial-thickness tear/plantar fasciitis.

LIGAMENTS

Lateral: The anterior and posterior talofibular, anterior and
posterior tibiofibular, and calcaneofibular ligaments are intact.

Medial: The tibiotalar deep deltoid and tibial spring ligaments are
intact.

CARTILAGE

Ankle Joint: Intact cartilage.

Subtalar Joints/Sinus Tarsi: Fat is preserved within sinus tarsi.

Bones: Minimal dorsal talonavicular degenerative spurring.

Other: The tarsal tunnel is unremarkable. The Lisfranc ligament
complex appears intact.
IMPRESSION: 1. Mild partial-thickness longitudinal tear of the posterior
peroneus brevis tendon starting just distal to the fibula.
2. Mild flexor hallucis longus tenosynovitis.
3. Mild extensor digitorum longus tenosynovitis at the level of the
anterior tibiotalar joint.
4. Minimal fluid around the Achilles tendon/paratenon, mild distal
paratenonitis
